# Patient Record
Sex: Female | Born: 1995
Health system: Southern US, Community
[De-identification: ages and names within clinical notes are randomized; demographics above are authoritative.]

## PROBLEM LIST (undated history)

## (undated) DIAGNOSIS — F32A Depression, unspecified: Secondary | ICD-10-CM

## (undated) DIAGNOSIS — F419 Anxiety disorder, unspecified: Secondary | ICD-10-CM

## (undated) HISTORY — DX: Anxiety disorder, unspecified: F41.9

---
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
## 2016-03-14 NOTE — Progress Notes (Deleted)
   CC: ***  HPI: Kim Smith is a 21 y.o. female with PMH significant for *** [facility] *** of *** duration.   Overdue health maintenance ***  Review of Symptoms:  See HPI for ROS.   CC, SH/smoking status, and VS noted.  Objective: There were no vitals taken for this visit. GEN: NAD, alert, cooperative, and pleasant.*** EYE: no conjunctival injection, pupils equally round and reactive to light ENMT: normal tympanic light reflex, no nasal polyps,no rhinorrhea, no pharyngeal erythema or exudates NECK: full ROM, no thyromegaly RESPIRATORY: clear to auscultation bilaterally with no wheezes, rhonchi or rales, good effort CV: RRR, no m/r/g, no peripheral edema GI: soft, non-tender, non-distended, normoactive bowel sounds, no hepatosplenomegaly SKIN: warm and dry, no rashes or lesions NEURO: II-XII grossly intact, normal gait, peripheral sensation intact PSYCH: AAOx3, appropriate affect  Flu Vaccine: *** Tdap Vaccine: *** - every 6072yrs - (<3 lifetime doses or unknown): all wounds -- look up need for Tetanus IG - (>=3 lifetime doses): clean/minor wound if >1372yrs from previous; all other wounds if >8367yrs from previous Zoster Vaccine: *** (those >50yo, once) Pneumonia Vaccine: *** (those w/ risk factors) - (<6058yr) Both: Immunocompromised, cochlear implant, CSF leak, asplenic, sickle cell, Chronic Renal Failure - (<6658yr) PPSV-23 only: Heart dz, lung disease, DM, tobacco abuse, alcoholism, cirrhosis/liver disease. - (>5658yr): PPSV13 then PPSV23 in 6-12mths;  - (>558yr): repeat PPSV23 once if pt received prior to 20yo and 6367yrs have passed  Assessment and plan:  No problem-specific Assessment & Plan notes found for this encounter.   No orders of the defined types were placed in this encounter.   No orders of the defined types were placed in this encounter.    Howard PouchLauren Arvle Grabe, MD,MS,  PGY1 03/14/2016 10:08 PM

## 2016-03-15 ENCOUNTER — Other Ambulatory Visit: Payer: Self-pay | Admitting: Family Medicine

## 2016-03-15 ENCOUNTER — Ambulatory Visit (INDEPENDENT_AMBULATORY_CARE_PROVIDER_SITE_OTHER): Payer: 59 | Admitting: Family Medicine

## 2016-03-15 VITALS — BP 110/72 | HR 96 | Temp 98.7°F | Ht 67.5 in | Wt 160.4 lb

## 2016-03-15 DIAGNOSIS — Z3009 Encounter for other general counseling and advice on contraception: Secondary | ICD-10-CM | POA: Diagnosis not present

## 2016-03-15 DIAGNOSIS — Z113 Encounter for screening for infections with a predominantly sexual mode of transmission: Secondary | ICD-10-CM

## 2016-03-15 NOTE — Assessment & Plan Note (Signed)
Patient currently has a Nexplanon. She has had this for about a year and is not happy with it. She would like to have her Nexplanon removed and an IUD inserted instead. -Patient will schedule appointment to have her Nexplanon removed an IUD inserted -We'll check for gonorrhea, chlamydia, RPR and HIV testing per patient's request -Continue condom use to prevent STDs

## 2016-03-15 NOTE — Patient Instructions (Signed)
Thank you for coming in today, it was so nice to see you! Today we talked about:    Please schedule a nexplanon removal and IUD insertion at our clinic. Ask the front desk to schedule you for a gynecology clinic visit.   If we ordered any tests today, you will be notified via telephone of any abnormalities. If everything is normal you will get a letter in the mail.   If you have any questions or concerns, please do not hesitate to call the office at [phone]. You can also message me directly via MyChart.   Sincerely,  Anders Simmondshristina Madeliene Tejera, MD

## 2016-03-15 NOTE — Progress Notes (Signed)
   Subjective:    Patient ID: Kim Smith , female   DOB: 1995-10-11 , 21 y.o..   MRN: [id number]  HPI  Kim Smith is here for  Chief Complaint  Patient presents with  . discuss birthcontrol   Patient is here today to discuss her birth control options. Patient notes that she has had a On in her left arm for the last year. Patient is not happy with her She feels like it has caused her to gain weight. She notes that she has gained approximately 20 pounds since she first had the Nexplanon placed. She also notes that the Nexlpanon has made her have a more labile mood including being more "emotional" and crying more often. She denies any depression or anxiety at this time. She just notes that sometimes she'll watch something saw on TV and will make her cry and this is not usual for her. She notes that she is sexually active at this time and is had 3 different partners in the last 6 months. She notes that she does use condoms to prevent STDs. She would like to have STD testing today just to be sure. She also notes that she has irregular bleeding since she has been on her Nexplanon. She does not have regular periods while she is on her On. She is interested in the 3 year IUD. She has done lots of research on the IUD and feels like it is the best option for her.  Review of Systems: Per HPI. All other systems reviewed and are negative.  Past Medical History: Patient Active Problem List   Diagnosis Date Noted  . General counseling and advice on female contraception 03/15/2016  . Perennial and seasonal allergic rhinitis 01/20/2015  . Allergy history, anesthetic 01/01/2015  . Breakthrough bleeding on Nexplanon 12/23/2014  . Constipation 12/10/2013  . External hemorrhoids without complication 12/10/2013  . Rectal bleeding 12/10/2013    Medications: reviewed and updated Current Outpatient Prescriptions  Medication Sig Dispense Refill  . etonogestrel (NEXPLANON) 68 MG IMPL implant 1 each by  Subdermal route once.    . fluconazole (DIFLUCAN) 150 MG tablet Take one tablet by mouth on Day 1. Repeat dose 2nd tablet on Day 3. 2 tablet 0  . fluticasone (FLONASE) 50 MCG/ACT nasal spray Place 1 spray into both nostrils 2 (two) times daily as needed for allergies or rhinitis. 16 g 3   No current facility-administered medications for this visit.     Social Hx:  reports that she has never smoked. She does not have any smokeless tobacco history on file.   Objective:   BP 110/72   Pulse 96   Temp 98.7 F (37.1 C) (Oral)   Ht 5' 7.5" (1.715 m)   Wt 160 lb 6.4 oz (72.8 kg)   SpO2 99%   BMI 24.75 kg/m  Physical Exam  Gen: NAD, alert, cooperative with exam, well-appearing Psych: good insight, normal mood and affect   Assessment & Plan:  General counseling and advice on female contraception Patient currently has a Nexplanon. She has had this for about a year and is not happy with it. She would like to have her Nexplanon removed and an IUD inserted instead. -Patient will schedule appointment to have her Nexplanon removed an IUD inserted -We'll check for gonorrhea, chlamydia, RPR and HIV testing per patient's request -Continue condom use to prevent STDs   Anders Simmondshristina Caden Fatica, MD [facility], PGY-2

## 2016-03-17 ENCOUNTER — Encounter: Payer: Self-pay | Admitting: Family Medicine

## 2016-03-17 ENCOUNTER — Ambulatory Visit (INDEPENDENT_AMBULATORY_CARE_PROVIDER_SITE_OTHER): Payer: 59 | Admitting: Family Medicine

## 2016-03-17 VITALS — BP 110/80 | HR 130 | Temp 99.9°F | Ht 67.0 in | Wt 160.0 lb

## 2016-03-17 DIAGNOSIS — Z3046 Encounter for surveillance of implantable subdermal contraceptive: Secondary | ICD-10-CM | POA: Diagnosis not present

## 2016-03-17 DIAGNOSIS — Z975 Presence of (intrauterine) contraceptive device: Secondary | ICD-10-CM

## 2016-03-17 DIAGNOSIS — Z30430 Encounter for insertion of intrauterine contraceptive device: Secondary | ICD-10-CM | POA: Diagnosis not present

## 2016-03-17 DIAGNOSIS — N921 Excessive and frequent menstruation with irregular cycle: Secondary | ICD-10-CM

## 2016-03-17 DIAGNOSIS — Z978 Presence of other specified devices: Secondary | ICD-10-CM

## 2016-03-17 MED ORDER — LEVONORGESTREL 20 MCG/24HR IU IUD
INTRAUTERINE_SYSTEM | Freq: Once | INTRAUTERINE | Status: AC
  Administered 2016-03-17: 1 via INTRAUTERINE

## 2016-03-17 NOTE — Progress Notes (Signed)
Patient has had a lot of break through bleeding wit the nexplanon so she wouldlike to have tat removed and try IUD. PROCEDURE NOTE: NEXPLANON  REMOVAL Patient given informed consent and signed copy in the chart. LEFT arm area prepped and draped in the usual sterile fashion. Three cc of lidocaine without epinephrine 1% used for local anesthesia. A small stab incision was made close to the nexplanon with scalpel. Hemostats were used to withdraw the nexplanon. A small bandage was applied over a steri strip  No complications.Patient given follow up instructions should she experience redness, swelling at sight or fever in the next 24 hours. Patient was reminded this totally removes her nexplanon contraceptive devise. (she can now potentially conceive)  IUD INSERTION: Patient given informed consent, signed copy in the chart..  Negative pregnancy confirmed.  Appropriate time out taken.   Sterile instruments and technique was used. Cervix brought into view with use of speculum and then cleansed three times with  betadine swabs.  A tenaculum was placed into the anterior lip of the cervix and a uterine sound was used to measure uterine size.   A Mirena IUD was placed into the endometrial cavity, deployed and secured. The applicator was removed. The strings were trimmed to 2 centimeters.   There were no complications and the patient tolerated the procedure well. She had some minor cramping and we administered 400 mg ibuprofen PO which she has used before without problem.   She was given handouts for post procedure instructions and information about the IUD including a card with the time of recommended removal. She was reminded that the IUD does not protect against sexually transmitted diseases.  Physicians involved in procedures: Dr. Elbert EwingsL. Mosetta PuttFeng and Dr Kathie RhodesS. Jennette KettleNeal

## 2016-03-18 MED ORDER — IBUPROFEN 200 MG PO TABS
400.0000 mg | ORAL_TABLET | Freq: Once | ORAL | Status: AC
  Administered 2016-03-17: 400 mg via ORAL

## 2016-04-04 ENCOUNTER — Encounter: Payer: Self-pay | Admitting: Internal Medicine

## 2016-04-21 LAB — HIV ANTIBODY (ROUTINE TESTING W REFLEX): HIV Screen 4th Generation wRfx: NONREACTIVE

## 2016-04-21 LAB — RPR: RPR Ser Ql: NONREACTIVE

## 2016-05-10 ENCOUNTER — Encounter (HOSPITAL_COMMUNITY): Payer: Self-pay | Admitting: Emergency Medicine

## 2016-05-10 ENCOUNTER — Ambulatory Visit (HOSPITAL_COMMUNITY)
Admission: EM | Admit: 2016-05-10 | Discharge: 2016-05-10 | Disposition: A | Discharge status: Home / Self Care | Payer: 59 | Attending: Family Medicine | Admitting: Family Medicine

## 2016-05-10 DIAGNOSIS — R091 Pleurisy: Secondary | ICD-10-CM

## 2016-05-10 DIAGNOSIS — J00 Acute nasopharyngitis [common cold]: Secondary | ICD-10-CM

## 2016-05-10 MED ORDER — IPRATROPIUM BROMIDE 0.06 % NA SOLN: 2.0000 | Freq: Four times a day (QID) | NASAL | 0 refills | Status: DC

## 2016-05-10 MED ORDER — IBUPROFEN 600 MG PO TABS: 600.0000 mg | ORAL_TABLET | Freq: Four times a day (QID) | ORAL | 0 refills | Status: DC | PRN

## 2016-05-10 MED FILL — IPRATROPIUM 0.06% SPRAY: 0.06 | 30 days supply | Qty: 15 | Fill #0

## 2016-05-10 MED FILL — IBUPROFEN 600 MG TABLET: 600 | 8 days supply | Qty: 30 | Fill #0

## 2016-05-10 NOTE — ED Provider Notes (Signed)
CSN: [id number]     Arrival date & time 05/10/16  1018 History   First MD Initiated Contact with Patient 05/10/16 1056     Chief Complaint  Patient presents with  . Pleurisy   (Consider location/radiation/quality/duration/timing/severity/associated sxs/prior Treatment) Patient c/o sharp pain right chest with respirations.  She states she has some sinus drainage.   The history is provided by the patient.  Chest Pain  Pain location:  R chest Pain quality: burning   Pain radiates to:  R shoulder Pain severity:  Moderate Onset quality:  Sudden Duration:  1 week Timing:  Constant Progression:  Worsening Chronicity:  New Relieved by:  Nothing Worsened by:  Nothing Ineffective treatments:  None tried   History reviewed. No pertinent past medical history. Past Surgical History:  Procedure Laterality Date  . NO PAST SURGERIES  01/20/15   History reviewed. No pertinent family history. Social History  Substance Use Topics  . Smoking status: Never Smoker  . Smokeless tobacco: Never Used  . Alcohol use Yes   OB History    No data available     Review of Systems  Constitutional: Negative.   HENT: Negative.   Eyes: Negative.   Respiratory: Negative.   Cardiovascular: Positive for chest pain.  Gastrointestinal: Negative.   Endocrine: Negative.   Genitourinary: Negative.   Musculoskeletal: Negative.   Allergic/Immunologic: Negative.   Neurological: Negative.   Hematological: Negative.   Psychiatric/Behavioral: Negative.     Allergies  Eggs or egg-derived products  Home Medications   Prior to Admission medications   Medication Sig Start Date End Date Taking? Authorizing Provider  ibuprofen (ADVIL,MOTRIN) 600 MG tablet Take 1 tablet (600 mg total) by mouth every 6 (six) hours as needed. 05/10/16   Deatra CanterWilliam J Carlesha Seiple, FNP  ipratropium (ATROVENT) 0.06 % nasal spray Place 2 sprays into both nostrils 4 (four) times daily. 05/10/16   Deatra CanterWilliam J Albert Hersch, FNP   Meds Ordered and  Administered this Visit  Medications - No data to display  BP 110/68 (BP Location: Right Arm)   Pulse 87   Temp 98.4 F (36.9 C) (Oral)   Resp 16   SpO2 100%  No data found.   Physical Exam  Constitutional: She appears well-developed and well-nourished.  HENT:  Head: Normocephalic and atraumatic.  Right Ear: External ear normal.  Left Ear: External ear normal.  Mouth/Throat: Oropharynx is clear and moist.  Eyes: Conjunctivae and EOM are normal. Pupils are equal, round, and reactive to light.  Neck: Normal range of motion. Neck supple.  Cardiovascular: Normal rate, regular rhythm and normal heart sounds.   Pulmonary/Chest: Effort normal and breath sounds normal.  Abdominal: Soft. Bowel sounds are normal.  Nursing note and vitals reviewed.   Urgent Care Course     Procedures (including critical care time)  Labs Review Labs Reviewed - No data to display  Imaging Review No results found.   Visual Acuity Review  Right Eye Distance:   Left Eye Distance:   Bilateral Distance:    Right Eye Near:   Left Eye Near:    Bilateral Near:         MDM   1. Pleurisy   2. Other acute rhinitis    Naprosyn 500mg  one po bid x 7 days #14 Atrovent Nasal Spray       Deatra CanterWilliam J Eero Dini, FNP 05/10/16 1153

## 2016-05-10 NOTE — ED Triage Notes (Signed)
The patient reported sharp pain in the right side of her rib cage when she takes a deep breath that started 2 weeks ago.

## 2016-05-11 ENCOUNTER — Ambulatory Visit: Payer: Self-pay | Admitting: Internal Medicine

## 2016-05-17 ENCOUNTER — Ambulatory Visit (INDEPENDENT_AMBULATORY_CARE_PROVIDER_SITE_OTHER): Payer: 59 | Admitting: Internal Medicine

## 2016-05-17 ENCOUNTER — Encounter: Payer: Self-pay | Admitting: Internal Medicine

## 2016-05-17 DIAGNOSIS — R091 Pleurisy: Secondary | ICD-10-CM

## 2016-05-17 MED ORDER — MELOXICAM 15 MG PO TABS: 15.0000 mg | ORAL_TABLET | Freq: Every day | ORAL | 0 refills | Status: DC

## 2016-05-17 MED FILL — MELOXICAM 15 MG TABLET: 15 | 30 days supply | Qty: 30 | Fill #0

## 2016-05-17 NOTE — Progress Notes (Signed)
   Subjective:   Patient: Kim Smith       Birthdate: [birth year]       MRN: [id number]      HPI  Kim Durieshley L Esteban is a 21 y.o. female presenting for same day clinic for f/u of pleurisy.   Pleurisy/pain with deep inspiration Patient was seen for same complaint on 03/20 in ED. She was diagnosed with pleuritic chest pain and rhinitis and discharged with ibuprofen and Atrovent. Patient presents today for f/u. Patient reports some improvement in pain but says it is still present. Pain resolves with 600mg  ibuprofen BID. Pain is present only on R. She has pain when laying on her R side at night, but is able to sleep on her L side without problems. Pain is worse with deep inspiration and when standing. Pain continues to occur daily. Says she feels like there is a "bubble" next to her lung when she takes a deep breath. Denies difficulty breathing, just pain.   Smoking status reviewed. Patient is never smoker.   Review of Systems See HPI.     Objective:  Physical Exam  Constitutional: She is oriented to person, place, and time and well-developed, well-nourished, and in no distress.  HENT:  Head: Normocephalic and atraumatic.  Eyes: Conjunctivae and EOM are normal. Right eye exhibits no discharge. Left eye exhibits no discharge.  Cardiovascular: Normal rate, regular rhythm and normal heart sounds.   No murmur heard. Pulmonary/Chest:  Lungs CTAB. Good air movement. No TTP of chest wall. Patient denies pain with deep inspiration during exam.   Neurological: She is alert and oriented to person, place, and time.  Skin: Skin is warm and dry.  Psychiatric: Affect and judgment normal.      Assessment & Plan:  Pleurisy Symptoms consistent with diagnosis of pleurisy given in ED. Reassuring that pain resolves with ibuprofen and that patient with no difficulty breathing. Lung exam benign and patient actually without pan during deep inspiration on lung exam. Discussed that it can take time for  inflammation to resolve, but that she can continue to take anti-inflammatory meds. Will switch to Mobic so patient can take fewer pills and only once a day.  - Mobic 15mg  qd - Return if pain persists even after completing pills. Will consider CXR at this time.  - Return if difficulty breathign   Tarri AbernethyAbigail J Darely Becknell, MD, MPH PGY-2 [facility] Pager [phone]

## 2016-05-17 NOTE — Patient Instructions (Addendum)
It was nice seeing you today Kim Smith!  You can begin taking Mobic (meloxicam) once a day. Taking it in the morning should help relieve your pain for the whole day. Do not take ibuprofen while you are taking Mobic. If you are still having pain, you can take Tylenol with Mobic.   If you are still having pain after finishing the Mobic I prescribed today, please call to schedule another appointment.   If you start to have trouble breathing or worsening pain, please go to the emergency room.   If you have any questions or concerns, please feel free to call the clinic.   Be well,  Dr. Natale MilchLancaster

## 2016-05-18 DIAGNOSIS — R091 Pleurisy: Secondary | ICD-10-CM | POA: Insufficient documentation

## 2016-05-18 NOTE — Assessment & Plan Note (Signed)
Symptoms consistent with diagnosis of pleurisy given in ED. Reassuring that pain resolves with ibuprofen and that patient with no difficulty breathing. Lung exam benign and patient actually without pan during deep inspiration on lung exam. Discussed that it can take time for inflammation to resolve, but that she can continue to take anti-inflammatory meds. Will switch to Mobic so patient can take fewer pills and only once a day.  - Mobic 15mg  qd - Return if pain persists even after completing pills. Will consider CXR at this time.  - Return if difficulty breathign

## 2016-06-01 ENCOUNTER — Encounter: Payer: Self-pay | Admitting: Gastroenterology

## 2016-06-09 ENCOUNTER — Other Ambulatory Visit (INDEPENDENT_AMBULATORY_CARE_PROVIDER_SITE_OTHER): Payer: 59

## 2016-06-09 ENCOUNTER — Ambulatory Visit (INDEPENDENT_AMBULATORY_CARE_PROVIDER_SITE_OTHER)
Admission: RE | Admit: 2016-06-09 | Discharge: 2016-06-09 | Disposition: A | Discharge status: Home / Self Care | Payer: 59 | Source: Ambulatory Visit | Attending: Gastroenterology | Admitting: Gastroenterology

## 2016-06-09 ENCOUNTER — Ambulatory Visit (INDEPENDENT_AMBULATORY_CARE_PROVIDER_SITE_OTHER): Payer: 59 | Admitting: Gastroenterology

## 2016-06-09 ENCOUNTER — Encounter: Payer: Self-pay | Admitting: Gastroenterology

## 2016-06-09 VITALS — BP 90/60 | HR 84 | Ht 67.0 in | Wt 157.0 lb

## 2016-06-09 DIAGNOSIS — R091 Pleurisy: Secondary | ICD-10-CM

## 2016-06-09 DIAGNOSIS — R109 Unspecified abdominal pain: Secondary | ICD-10-CM | POA: Diagnosis not present

## 2016-06-09 DIAGNOSIS — R1011 Right upper quadrant pain: Secondary | ICD-10-CM

## 2016-06-09 LAB — CBC WITH DIFFERENTIAL/PLATELET
Basophils Absolute: 0.1 10*3/uL (ref 0.0–0.1)
Basophils Relative: 0.5 % (ref 0.0–3.0)
Eosinophils Absolute: 0.1 10*3/uL (ref 0.0–0.7)
Eosinophils Relative: 0.7 % (ref 0.0–5.0)
HCT: 40.5 % (ref 36.0–46.0)
Hemoglobin: 13.4 g/dL (ref 12.0–15.0)
Lymphocytes Relative: 27.2 % (ref 12.0–46.0)
Lymphs Abs: 3.1 10*3/uL (ref 0.7–4.0)
MCHC: 33.2 g/dL (ref 30.0–36.0)
MCV: 87.8 fl (ref 78.0–100.0)
Monocytes Absolute: 0.9 10*3/uL (ref 0.1–1.0)
Monocytes Relative: 7.7 % (ref 3.0–12.0)
Neutro Abs: 7.2 10*3/uL (ref 1.4–7.7)
Neutrophils Relative %: 63.9 % (ref 43.0–77.0)
Platelets: 286 10*3/uL (ref 150.0–400.0)
RBC: 4.62 Mil/uL (ref 3.87–5.11)
RDW: 13.7 % (ref 11.5–14.6)
WBC: 11.3 10*3/uL — ABNORMAL HIGH (ref 4.5–10.5)

## 2016-06-09 LAB — COMPREHENSIVE METABOLIC PANEL
ALT: 28 U/L (ref 0–35)
AST: 17 U/L (ref 0–37)
Albumin: 4.1 g/dL (ref 3.5–5.2)
Alkaline Phosphatase: 135 U/L — ABNORMAL HIGH (ref 39–117)
BUN: 13 mg/dL (ref 6–23)
CO2: 28 mEq/L (ref 19–32)
Calcium: 9.6 mg/dL (ref 8.4–10.5)
Chloride: 105 mEq/L (ref 96–112)
Creatinine, Ser: 0.61 mg/dL (ref 0.40–1.20)
GFR: 132.68 mL/min (ref >60.00)
Glucose, Bld: 76 mg/dL (ref 70–99)
Potassium: 3.9 mEq/L (ref 3.5–5.1)
Sodium: 138 mEq/L (ref 135–145)
Total Bilirubin: 0.4 mg/dL (ref 0.2–1.2)
Total Protein: 8.2 g/dL (ref 6.0–8.3)

## 2016-06-09 LAB — HCG, QUANTITATIVE, PREGNANCY: Quantitative HCG: 0.14 m[IU]/mL

## 2016-06-09 NOTE — Progress Notes (Signed)
06/09/2016 IRVING LUBBERS [id number] 03-08-1995   HISTORY OF PRESENT ILLNESS:  This is a 21 year old female who is new to our office. She presents to our office today with her mother at the request of her PCP, Dr. Natale Milch, for evaluation of what was diagnosed as "pleurisy".  She reports that she has pain up underneath her right rib cage that hurts when she takes a deep breath.  Says that pain shoots around into her right shoulder blade and up the right side of her back when she takes a deep breath.  She tells me that this has been present for about the past month.  She was in the ED and has seen her PCP but no evaluation has been done.  She was told to take Mobic, which she says does help that pain.  Says that if she does not take it then it is very severe.  She reports some other random GI complaints, but they are not associated with this pain and have been present for a while.  She says that she has had constipation as long as she can remember.  Reports that on rare occasion she will get nausea.  Feels full sometimes after drinking liquids, even water, but this does not occur with eating food.  Gets occasional diarrhea/loose stools with certain foods.  Just of note, she is sexually active and has been evaluated on a couple of occasions for STD's.  She denies pelvic pain, but does report increase in vaginal discharge but says that she thought it was from her IUD (that was placed in February).  Was just thinking of possible Lynnae January as cause for this pleuritic RUQ pain.   Past Medical History:  Diagnosis Date  . Anxiety    Past Surgical History:  Procedure Laterality Date  . NO PAST SURGERIES  01/20/15    reports that she has never smoked. She has never used smokeless tobacco. She reports that she does not drink alcohol or use drugs. family history includes Breast cancer in her mother; Diabetes in her maternal grandmother and paternal grandmother. Allergies  Allergen Reactions    . Eggs Or Egg-Derived Products Nausea And Vomiting      Outpatient Encounter Prescriptions as of 06/09/2016  Medication Sig  . ibuprofen (ADVIL,MOTRIN) 600 MG tablet Take 1 tablet (600 mg total) by mouth every 6 (six) hours as needed.  . meloxicam (MOBIC) 15 MG tablet Take 1 tablet (15 mg total) by mouth daily.  . [DISCONTINUED] ipratropium (ATROVENT) 0.06 % nasal spray Place 2 sprays into both nostrils 4 (four) times daily.   No facility-administered encounter medications on file as of 06/09/2016.      REVIEW OF SYSTEMS  : All other systems reviewed and negative except where noted in the History of Present Illness.   PHYSICAL EXAM: BP 90/60   Pulse 84   Ht  (1.702 m)   Wt 157 lb (71.2 kg)   BMI 24.59 kg/m  General: Well developed female in no acute distress Head: Normocephalic and atraumatic Eyes:  Sclerae anicteric, conjunctiv pink. Ears: Normal auditory acuity  Lungs: Clear throughout to auscultation; no increased WOB. Heart: Regular rate and rhythm Abdomen: Soft, non-distended.  Normal bowel sounds.  RUQ TTP, also seems to be tender over her ribcage in the same area. Musculoskeletal: Symmetrical with no gross deformities  Skin: No lesions on visible extremities Extremities: No edema  Neurological: Alert oriented x 4, grossly non-focal Psychological:  Alert and  cooperative. Normal mood and affect  ASSESSMENT AND PLAN: -21 year old female with complaints of RUQ abdominal pain.  Likely worsened by taking a deep breath. Improved by taking NSAIDs. No other hard/definite GI symptoms. Reports intermittent bloating, rare nausea, intermittent loose stools, chronic constipation, but not anything necessarily in association with this pain. She is tender over her ribcage in that area as well. Question gallbladder related versus costochondritis.  We'll check some labs including a CBC, CMP. We'll schedule an abdominal ultrasound. Advised that she can begin taking a daily probiotic for  her other occasional, mild GI symptoms which are likely functional.  CC:  Kim Smith*

## 2016-06-09 NOTE — Patient Instructions (Signed)
If you are age 20 or older, your body mass index should be between 23-30. Your Body mass index is 24.59 kg/m. If this is out of the aforementioned range listed, please consider follow up with your Primary Care Provider.  If you are age 31 or younger, your body mass index should be between 19-25. Your Body mass index is 24.59 kg/m. If this is out of the aformentioned range listed, please consider follow up with your Primary Care Provider.   You have been scheduled for an abdominal ultrasound at [facility] (1st floor of hospital) on 06/13/16 at 1100 am. Please arrive 15 minutes prior to your appointment for registration. Make certain not to have anything to eat or drink 8 hours prior to your appointment. Should you need to reschedule your appointment, please contact radiology at [phone]. This test typically takes about 30 minutes to perform.  Your physician has requested that you go to the basement for lab work and x-ray before leaving today.  Start Probiotic such as Land, Culturelle, or Align daily.  Thank you for choosing me and [facility].  Doug Sou, PA-C

## 2016-06-10 DIAGNOSIS — R1011 Right upper quadrant pain: Secondary | ICD-10-CM | POA: Insufficient documentation

## 2016-06-10 NOTE — Progress Notes (Signed)
Agree with assessment and plan as outlined.  

## 2016-06-13 ENCOUNTER — Ambulatory Visit (HOSPITAL_COMMUNITY)
Admission: RE | Admit: 2016-06-13 | Discharge: 2016-06-13 | Disposition: A | Discharge status: Home / Self Care | Payer: 59 | Source: Ambulatory Visit | Attending: Gastroenterology | Admitting: Gastroenterology

## 2016-06-13 DIAGNOSIS — R1011 Right upper quadrant pain: Secondary | ICD-10-CM | POA: Diagnosis not present

## 2016-09-23 ENCOUNTER — Ambulatory Visit: Payer: 59 | Admitting: Internal Medicine

## 2016-09-26 ENCOUNTER — Ambulatory Visit (INDEPENDENT_AMBULATORY_CARE_PROVIDER_SITE_OTHER): Payer: 59 | Admitting: Internal Medicine

## 2016-09-26 ENCOUNTER — Encounter: Payer: Self-pay | Admitting: Internal Medicine

## 2016-09-26 ENCOUNTER — Encounter: Payer: Self-pay | Admitting: Psychology

## 2016-09-26 VITALS — BP 102/62 | HR 67 | Temp 98.2°F | Ht 67.5 in | Wt 155.0 lb

## 2016-09-26 DIAGNOSIS — Z978 Presence of other specified devices: Secondary | ICD-10-CM | POA: Diagnosis not present

## 2016-09-26 DIAGNOSIS — F418 Other specified anxiety disorders: Secondary | ICD-10-CM | POA: Insufficient documentation

## 2016-09-26 DIAGNOSIS — Z975 Presence of (intrauterine) contraceptive device: Secondary | ICD-10-CM

## 2016-09-26 DIAGNOSIS — F411 Generalized anxiety disorder: Secondary | ICD-10-CM

## 2016-09-26 DIAGNOSIS — N921 Excessive and frequent menstruation with irregular cycle: Secondary | ICD-10-CM

## 2016-09-26 LAB — POCT URINE PREGNANCY: Preg Test, Ur: NEGATIVE

## 2016-09-26 MED ORDER — CITALOPRAM HYDROBROMIDE 10 MG PO TABS: 10.0000 mg | ORAL_TABLET | Freq: Every day | ORAL | 0 refills | Status: DC

## 2016-09-26 MED ORDER — HYDROXYZINE HCL 50 MG PO TABS: 50.0000 mg | ORAL_TABLET | Freq: Three times a day (TID) | ORAL | 0 refills | Status: DC | PRN

## 2016-09-26 MED FILL — CITALOPRAM HBR 10 MG TABLET: 10 | 30 days supply | Qty: 30 | Fill #0

## 2016-09-26 MED FILL — hydrOXYzine HCL 50 MG TABS: 50 | 10 days supply | Qty: 30 | Fill #0
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
## 2016-09-26 NOTE — Assessment & Plan Note (Signed)
GAD-7 15, significantly impacting her daily life. Will check patient for metabolic disorders which could cause anxiety though unlikely. -Will provide patient with Celexa can increase the dose from 10 mg to 20 mg -Hydroxyzine as needed for significant episodes of anxiety - TSH - CBC - CMP14+EGFR - citalopram (CELEXA) 10 MG tablet; Take 1 tablet (10 mg total) by mouth daily.  Dispense: 30 tablet; Refill: 0 - hydrOXYzine (ATARAX/VISTARIL) 50 MG tablet; Take 1 tablet (50 mg total) by mouth 3 (three) times daily as needed.  Dispense: 30 tablet; Refill: 0 - POCT urine pregnancy - noted nausea; though has an IUD in place

## 2016-09-26 NOTE — Patient Instructions (Signed)
I want to start you on a low dose medication called Celexa - after 1 week used to not have any significant symptoms can increase from 1 pill to 2 pills . If you are having significant anxiety you can take the Vistaril in between. I want you to follow-up with behavioral medicine. I want you to come back in 2 weeks.

## 2016-09-27 ENCOUNTER — Encounter: Payer: Self-pay | Admitting: Internal Medicine

## 2016-09-27 ENCOUNTER — Ambulatory Visit: Payer: 59 | Admitting: Internal Medicine

## 2016-09-27 LAB — CBC
Hematocrit: 46.3 % (ref 34.0–46.6)
Hemoglobin: 15.4 g/dL (ref 11.1–15.9)
MCH: 29.7 pg (ref 26.6–33.0)
MCHC: 33.3 g/dL (ref 31.5–35.7)
MCV: 89 fL (ref 79–97)
Platelets: 263 10*3/uL (ref 150–379)
RBC: 5.18 x10E6/uL (ref 3.77–5.28)
RDW: 14.7 % (ref 12.3–15.4)
WBC: 9.5 10*3/uL (ref 3.4–10.8)

## 2016-09-27 LAB — CMP14+EGFR
ALT: 20 IU/L (ref 0–32)
AST: 22 IU/L (ref 0–40)
Albumin/Globulin Ratio: 1.3 (ref 1.2–2.2)
Albumin: 4.3 g/dL (ref 3.5–5.5)
Alkaline Phosphatase: 130 IU/L — ABNORMAL HIGH (ref 39–117)
BUN/Creatinine Ratio: 11 (ref 9–23)
BUN: 7 mg/dL (ref 6–20)
Bilirubin Total: 0.3 mg/dL (ref 0.0–1.2)
CO2: 23 mmol/L (ref 20–29)
Calcium: 9.8 mg/dL (ref 8.7–10.2)
Chloride: 104 mmol/L (ref 96–106)
Creatinine, Ser: 0.64 mg/dL (ref 0.57–1.00)
GFR calc Af Amer: 149 mL/min/{1.73_m2} (ref >59)
GFR calc non Af Amer: 129 mL/min/{1.73_m2} (ref >59)
Globulin, Total: 3.2 g/dL (ref 1.5–4.5)
Glucose: 89 mg/dL (ref 65–99)
Potassium: 4.8 mmol/L (ref 3.5–5.2)
Sodium: 141 mmol/L (ref 134–144)
Total Protein: 7.5 g/dL (ref 6.0–8.5)

## 2016-09-27 LAB — TSH: TSH: 0.999 u[IU]/mL (ref 0.450–4.500)

## 2016-10-03 ENCOUNTER — Telehealth: Payer: Self-pay | Admitting: Psychology

## 2016-10-03 ENCOUNTER — Ambulatory Visit: Payer: Self-pay | Admitting: Psychology

## 2016-10-03 NOTE — Telephone Encounter (Signed)
I called the patient to give her information about a referral and the anxiety issue that Spero GeraldsMichelle Kane brought up. She did not answer the phone so message was left.

## 2016-10-12 ENCOUNTER — Ambulatory Visit (INDEPENDENT_AMBULATORY_CARE_PROVIDER_SITE_OTHER): Payer: 59 | Admitting: Internal Medicine

## 2016-10-12 ENCOUNTER — Encounter: Payer: Self-pay | Admitting: Internal Medicine

## 2016-10-12 DIAGNOSIS — F411 Generalized anxiety disorder: Secondary | ICD-10-CM | POA: Diagnosis not present

## 2016-10-12 MED ORDER — CITALOPRAM HYDROBROMIDE 40 MG PO TABS: 40.0000 mg | ORAL_TABLET | Freq: Every day | ORAL | 3 refills | Status: DC

## 2016-10-12 MED FILL — CITALOPRAM HBR 40 MG TABLET: 40 | 30 days supply | Qty: 30 | Fill #0

## 2016-10-12 NOTE — Patient Instructions (Addendum)
It was nice seeing you again today Shreenika!  Please increase your Celexa dose to 40mg  daily. You can continue taking four 10 mg tablets until you run out, then switch to one 40 mg tablet per day. Please keep your appointment with Carollee Herter on Monday.   I will see you back in six weeks to see how you are doing, but feel free to schedule an appointment sooner if you would like.   If you have any questions or concerns, please feel free to call the clinic.   Be well,  Dr. Natale Milch

## 2016-10-12 NOTE — Assessment & Plan Note (Signed)
Some improvement since beginning Celexa 20mg , though still with significant anxiety and associated physical symptoms. Patient is interested in increasing dose today. Not experiencing side effect of fatigue anymore after beginning to take dose before bed. Will increase to 40mg  daily and f/u in 6 weeks. Patient also amenable to continuing to see [facility], and has appt in four days.  - F/u in 6 weeks - Continue following with [facility] Carollee Herter [place])
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
## 2016-10-17 ENCOUNTER — Telehealth: Payer: Self-pay | Admitting: Psychology

## 2016-10-17 NOTE — Telephone Encounter (Signed)
I called the patient again today and was able to speak with her. She was given contact information for [facility] and [facility] of the [place]. I advised her that she has a provisional diagnosis of PTSD based upon her PCL5. I explained that outpatient therapy consisting of PE or CPT is recommended. I also explained that her marijuana usage may be exacerbating or even causing some of her anxiety symptoms. She verbalized understanding. I asked her if I could call her again to check on her progress and she said that would be fine. She will call [facility] as needed.

## 2017-02-23 ENCOUNTER — Telehealth: Payer: Self-pay | Admitting: Internal Medicine

## 2017-03-09 ENCOUNTER — Encounter: Payer: Self-pay | Admitting: Internal Medicine

## 2017-03-09 ENCOUNTER — Ambulatory Visit (INDEPENDENT_AMBULATORY_CARE_PROVIDER_SITE_OTHER): Payer: No Typology Code available for payment source | Admitting: Internal Medicine

## 2017-03-09 VITALS — BP 106/64 | HR 74 | Temp 98.0°F | Resp 16 | Ht 68.0 in | Wt 157.8 lb

## 2017-03-09 DIAGNOSIS — Z124 Encounter for screening for malignant neoplasm of cervix: Secondary | ICD-10-CM | POA: Diagnosis not present

## 2017-03-09 DIAGNOSIS — Z23 Encounter for immunization: Secondary | ICD-10-CM

## 2017-03-09 DIAGNOSIS — F418 Other specified anxiety disorders: Secondary | ICD-10-CM

## 2017-03-09 MED ORDER — VILAZODONE HCL 10 & 20 MG PO KIT
1.0000 | PACK | Freq: Every day | ORAL | 0 refills | Status: DC
Start: 2017-03-09 — End: 2017-05-16

## 2017-03-09 NOTE — Progress Notes (Signed)
Subjective:  Patient ID: Kim Smith, female    DOB: 1996-02-13  Age: 22 y.o. MRN: [id number]  CC: Depression   HPI Kim Smith presents for ongoing concerns about anxiety, panic attacks, and trichotillomania.  She was previously treated with citalopram but did not notice much improvement.  She otherwise feels well and offers no other complaints today.  History Kim Smith has a past medical history of Anxiety.   She has a past surgical history that includes No past surgeries (01/20/15).   Her family history includes Breast cancer in her mother; Diabetes in her maternal grandmother and paternal grandmother.She reports that  has never smoked. she has never used smokeless tobacco. She reports that she does not drink alcohol or use drugs.  Outpatient Medications Prior to Visit  Medication Sig Dispense Refill  . citalopram (CELEXA) 40 MG tablet Take 1 tablet (40 mg total) by mouth daily. 30 tablet 3  . hydrOXYzine (ATARAX/VISTARIL) 50 MG tablet Take 1 tablet (50 mg total) by mouth 3 (three) times daily as needed. 30 tablet 0  . ibuprofen (ADVIL,MOTRIN) 600 MG tablet Take 1 tablet (600 mg total) by mouth every 6 (six) hours as needed. 30 tablet 0  . meloxicam (MOBIC) 15 MG tablet Take 1 tablet (15 mg total) by mouth daily. 30 tablet 0   No facility-administered medications prior to visit.     ROS Review of Systems  Constitutional: Negative.  Negative for appetite change, diaphoresis, fatigue and unexpected weight change.  HENT: Negative.  Negative for trouble swallowing.   Eyes: Negative for visual disturbance.  Respiratory: Negative for cough, chest tightness, shortness of breath and wheezing.   Cardiovascular: Negative for chest pain, palpitations and leg swelling.  Gastrointestinal: Negative for abdominal pain.  Endocrine: Negative.   Genitourinary: Negative.  Negative for vaginal bleeding and vaginal discharge.  Musculoskeletal: Negative.  Negative for myalgias and neck pain.    Skin: Negative.   Neurological: Negative.   Hematological: Negative for adenopathy. Does not bruise/bleed easily.  Psychiatric/Behavioral: Negative for confusion, decreased concentration, dysphoric mood, self-injury, sleep disturbance and suicidal ideas. The patient is nervous/anxious.     Objective:  BP 106/64 (BP Location: Left Arm, Patient Position: Sitting, Cuff Size: Normal)   Pulse 74   Temp 98 F (36.7 C) (Oral)   Resp 16   Ht '5\' 8"'  (1.727 m)   Wt 157 lb 12 oz (71.6 kg)   SpO2 98%   BMI 23.99 kg/m   Physical Exam  Constitutional: She is oriented to person, place, and time. No distress.  HENT:  Mouth/Throat: No oropharyngeal exudate.  Eyes: Conjunctivae are normal. Left eye exhibits no discharge.  Neck: Normal range of motion. Neck supple. No JVD present. No thyromegaly present.  Cardiovascular: Normal rate, regular rhythm and normal heart sounds. Exam reveals no gallop.  No murmur heard. Pulmonary/Chest: Effort normal and breath sounds normal. No respiratory distress. She has no wheezes. She has no rales.  Abdominal: Soft. Bowel sounds are normal. She exhibits no distension and no mass. There is no tenderness.  Musculoskeletal: Normal range of motion. She exhibits no edema, tenderness or deformity.  Lymphadenopathy:    She has no cervical adenopathy.  Neurological: She is alert and oriented to person, place, and time.  Skin: Skin is warm. No rash noted. She is not diaphoretic. No erythema. No pallor.  Psychiatric: She has a normal mood and affect. Her behavior is normal. Judgment and thought content normal.  Vitals reviewed.   Lab Results  Component Value Date   WBC 9.5 09/26/2016   HGB 15.4 09/26/2016   HCT 46.3 09/26/2016   PLT 263 09/26/2016   GLUCOSE 89 09/26/2016   CHOL 167 11/05/2012   TRIG 87 11/05/2012   HDL 55 11/05/2012   LDLCALC 95 11/05/2012   ALT 20 09/26/2016   AST 22 09/26/2016   NA 141 09/26/2016   K 4.8 09/26/2016   CL 104 09/26/2016    CREATININE 0.64 09/26/2016   BUN 7 09/26/2016   CO2 23 09/26/2016   TSH 0.999 09/26/2016    Assessment & Plan:   Kim Smith was seen today for depression.  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  Need for influenza vaccination -     Flu Vaccine QUAD 36+ mos IM   I have discontinued Kim Smith's ibuprofen, meloxicam, hydrOXYzine, and citalopram. I am also having her start on Vilazodone HCl.  Meds ordered this encounter  Medications  . Vilazodone HCl (VIIBRYD STARTER PACK) 10 & 20 MG KIT    Sig: Take 1 tablet by mouth daily.    Dispense:  1 kit    Refill:  0     Follow-up: Return in about 3 months (around 06/07/2017).  Scarlette Calico, MD

## 2017-03-09 NOTE — Patient Instructions (Signed)
Major Depressive Disorder, Adult Major depressive disorder (MDD) is a mental health condition. It may also be called clinical depression or unipolar depression. MDD usually causes feelings of sadness, hopelessness, or helplessness. MDD can also cause physical symptoms. It can interfere with work, school, relationships, and other everyday activities. MDD may be mild, moderate, or severe. It may occur once (single episode major depressive disorder) or it may occur multiple times (recurrent major depressive disorder). What are the causes? The exact cause of this condition is not known. MDD is most likely caused by a combination of things, which may include:  Genetic factors. These are traits that are passed along from parent to child.  Individual factors. Your personality, your behavior, and the way you handle your thoughts and feelings may contribute to MDD. This includes personality traits and behaviors learned from others.  Physical factors, such as: ? Differences in the part of your brain that controls emotion. This part of your brain may be different than it is in people who do not have MDD. ? Long-term (chronic) medical or psychiatric illnesses.  Social factors. Traumatic experiences or major life changes may play a role in the development of MDD.  What increases the risk? This condition is more likely to develop in women. The following factors may also make you more likely to develop MDD:  A family history of depression.  Troubled family relationships.  Abnormally low levels of certain brain chemicals.  Traumatic events in childhood, especially abuse or the loss of a parent.  Being under a lot of stress, or long-term stress, especially from upsetting life experiences or losses.  A history of: ? Chronic physical illness. ? Other mental health disorders. ? Substance abuse.  Poor living conditions.  Experiencing social exclusion or discrimination on a regular basis.  What are  the signs or symptoms? The main symptoms of MDD typically include:  Constant depressed or irritable mood.  Loss of interest in things and activities.  MDD symptoms may also include:  Sleeping or eating too much or too little.  Unexplained weight change.  Fatigue or low energy.  Feelings of worthlessness or guilt.  Difficulty thinking clearly or making decisions.  Thoughts of suicide or of harming others.  Physical agitation or weakness.  Isolation.  Severe cases of MDD may also occur with other symptoms, such as:  Delusions or hallucinations, in which you imagine things that are not real (psychotic depression).  Low-level depression that lasts at least a year (chronic depression or persistent depressive disorder).  Extreme sadness and hopelessness (melancholic depression).  Trouble speaking and moving (catatonic depression).  How is this diagnosed? This condition may be diagnosed based on:  Your symptoms.  Your medical history, including your mental health history. This may involve tests to evaluate your mental health. You may be asked questions about your lifestyle, including any drug and alcohol use, and how long you have had symptoms of MDD.  A physical exam.  Blood tests to rule out other conditions.  You must have a depressed mood and at least four other MDD symptoms most of the day, nearly every day in the same 2-week timeframe before your health care provider can confirm a diagnosis of MDD. How is this treated? This condition is usually treated by mental health professionals, such as psychologists, psychiatrists, and clinical social workers. You may need more than one type of treatment. Treatment may include:  Psychotherapy. This is also called talk therapy or counseling. Types of psychotherapy include: ? Cognitive behavioral   therapy (CBT). This type of therapy teaches you to recognize unhealthy feelings, thoughts, and behaviors, and replace them with  positive thoughts and actions. ? Interpersonal therapy (IPT). This helps you to improve the way you relate to and communicate with others. ? Family therapy. This treatment includes members of your family.  Medicine to treat anxiety and depression, or to help you control certain emotions and behaviors.  Lifestyle changes, such as: ? Limiting alcohol and drug use. ? Exercising regularly. ? Getting plenty of sleep. ? Making healthy eating choices. ? Spending more time outdoors.  Treatments involving stimulation of the brain can be used in situations with extremely severe symptoms, or when medicine or other therapies do not work over time. These treatments include electroconvulsive therapy, transcranial magnetic stimulation, and vagal nerve stimulation. Follow these instructions at home: Activity  Return to your normal activities as told by your health care provider.  Exercise regularly and spend time outdoors as told by your health care provider. General instructions  Take over-the-counter and prescription medicines only as told by your health care provider.  Do not drink alcohol. If you drink alcohol, limit your alcohol intake to no more than 1 drink a day for nonpregnant women and 2 drinks a day for men. One drink equals 12 oz of beer, 5 oz of wine, or 1 oz of hard liquor. Alcohol can affect any antidepressant medicines you are taking. Talk to your health care provider about your alcohol use.  Eat a healthy diet and get plenty of sleep.  Find activities that you enjoy doing, and make time to do them.  Consider joining a support group. Your health care provider may be able to recommend a support group.  Keep all follow-up visits as told by your health care provider. This is important. Where to find more information: National Alliance on Mental Illness  www.nami.org  [place] National Institute of Mental Health  www.nimh.nih.gov  National Suicide Prevention  Lifeline  1-800-273-TALK (8255). This is free, 24-hour help.  Contact a health care provider if:  Your symptoms get worse.  You develop new symptoms. Get help right away if:  You self-harm.  You have serious thoughts about hurting yourself or others.  You see, hear, taste, smell, or feel things that are not present (hallucinate). This information is not intended to replace advice given to you by your health care provider. Make sure you discuss any questions you have with your health care provider. Document Released: 06/04/2012 Document Revised: 10/15/2015 Document Reviewed: 08/19/2015 Elsevier Interactive Patient Education  2018 [employer]  

## 2017-03-13 ENCOUNTER — Encounter: Payer: Self-pay | Admitting: Internal Medicine

## 2017-03-22 ENCOUNTER — Encounter: Payer: Self-pay | Admitting: Internal Medicine

## 2017-03-22 NOTE — Telephone Encounter (Signed)
Error

## 2017-05-16 ENCOUNTER — Ambulatory Visit (INDEPENDENT_AMBULATORY_CARE_PROVIDER_SITE_OTHER): Payer: No Typology Code available for payment source | Admitting: Internal Medicine

## 2017-05-16 ENCOUNTER — Encounter: Payer: Self-pay | Admitting: Internal Medicine

## 2017-05-16 VITALS — BP 124/84 | HR 98 | Temp 97.3°F | Resp 12 | Ht 68.0 in | Wt 163.0 lb

## 2017-05-16 DIAGNOSIS — J029 Acute pharyngitis, unspecified: Secondary | ICD-10-CM

## 2017-05-16 DIAGNOSIS — J020 Streptococcal pharyngitis: Secondary | ICD-10-CM

## 2017-05-16 LAB — POCT RAPID STREP A (OFFICE): Rapid Strep A Screen: POSITIVE — AB

## 2017-05-16 MED ORDER — AMOXICILLIN 875 MG PO TABS: 875.0000 mg | ORAL_TABLET | Freq: Two times a day (BID) | ORAL | 0 refills | Status: AC

## 2017-05-16 MED FILL — AMOXICILLIN 875 MG TABLET: 875 | 10 days supply | Qty: 20 | Fill #0

## 2017-05-16 NOTE — Progress Notes (Signed)
Subjective:  Patient ID: Kim Smith, female    DOB: Apr 17, 1995  Age: 22 y.o. MRN: [id number]  CC: Sore Throat   HPI Kim Smith presents for a 2-day history of sore throat, chills, odynophagia, and cervical lymphadenopathy.  Outpatient Medications Prior to Visit  Medication Sig Dispense Refill  . Vilazodone HCl (VIIBRYD STARTER PACK) 10 & 20 MG KIT Take 1 tablet by mouth daily. (Patient not taking: Reported on 05/16/2017) 1 kit 0   No facility-administered medications prior to visit.     ROS Review of Systems  Constitutional: Positive for chills. Negative for fatigue and fever.  HENT: Positive for sore throat and trouble swallowing. Negative for facial swelling, sinus pressure and voice change.   Eyes: Negative.   Respiratory: Negative.  Negative for cough, choking, chest tightness, shortness of breath, wheezing and stridor.   Cardiovascular: Negative for chest pain, palpitations and leg swelling.  Gastrointestinal: Negative.  Negative for abdominal pain, constipation, diarrhea, nausea and vomiting.  Genitourinary: Negative.  Negative for difficulty urinating.  Musculoskeletal: Negative.  Negative for back pain and neck pain.  Skin: Negative for color change, pallor and rash.  Neurological: Negative.  Negative for dizziness, weakness and light-headedness.  Hematological: Negative for adenopathy. Does not bruise/bleed easily.  Psychiatric/Behavioral: Negative.     Objective:  BP 124/84 (BP Location: Left Arm, Patient Position: Sitting, Cuff Size: Normal)   Pulse 98   Temp (!) 97.3 F (36.3 C) (Oral)   Resp 12   Ht '5\' 8"'  (1.727 m)   Wt 163 lb 0.6 oz (74 kg)   LMP  (LMP Unknown)   SpO2 98%   BMI 24.79 kg/m   BP Readings from Last 3 Encounters:  05/16/17 124/84  03/09/17 106/64  10/12/16 106/72    Wt Readings from Last 3 Encounters:  05/16/17 163 lb 0.6 oz (74 kg)  03/09/17 157 lb 12 oz (71.6 kg)  10/12/16 149 lb (67.6 kg)    Physical Exam    Constitutional: She is oriented to person, place, and time.  Non-toxic appearance. She does not have a sickly appearance. She does not appear ill. No distress.  HENT:  Mouth/Throat: Mucous membranes are not pale, not dry and not cyanotic. Oropharyngeal exudate and posterior oropharyngeal erythema present. No posterior oropharyngeal edema or tonsillar abscesses.  There is mild bilateral tonsillar hypertrophy with erythema and a scant exudate  Eyes: Conjunctivae are normal. Left eye exhibits no discharge. No scleral icterus.  Neck: Neck supple. No thyromegaly present.  Cardiovascular: Normal rate, regular rhythm and normal heart sounds. Exam reveals no gallop.  No murmur heard. Pulmonary/Chest: Effort normal and breath sounds normal. No stridor. No respiratory distress. She has no wheezes. She has no rales.  Abdominal: Soft. Bowel sounds are normal. She exhibits no mass. There is no tenderness. There is no guarding.  Musculoskeletal: Normal range of motion. She exhibits no edema or tenderness.  Lymphadenopathy:       Head (right side): No occipital adenopathy present.       Head (left side): No occipital adenopathy present.    She has cervical adenopathy.       Right cervical: Superficial cervical adenopathy present. No deep cervical and no posterior cervical adenopathy present.      Left cervical: Superficial cervical adenopathy present. No deep cervical and no posterior cervical adenopathy present.    She has no axillary adenopathy.       Right: No supraclavicular adenopathy present.       Left:  No supraclavicular adenopathy present.  Neurological: She is alert and oriented to person, place, and time.  Skin: Skin is warm and dry. No rash noted. She is not diaphoretic. No erythema. No pallor.  Vitals reviewed.   Lab Results  Component Value Date   WBC 9.5 09/26/2016   HGB 15.4 09/26/2016   HCT 46.3 09/26/2016   PLT 263 09/26/2016   GLUCOSE 89 09/26/2016   CHOL 167 11/05/2012   TRIG  87 11/05/2012   HDL 55 11/05/2012   LDLCALC 95 11/05/2012   ALT 20 09/26/2016   AST 22 09/26/2016   NA 141 09/26/2016   K 4.8 09/26/2016   CL 104 09/26/2016   CREATININE 0.64 09/26/2016   BUN 7 09/26/2016   CO2 23 09/26/2016   TSH 0.999 09/26/2016    US Abdomen Complete  Result Date: 06/13/2016 CLINICAL DATA:  Right upper quadrant pain for 2 months with nausea EXAM: ABDOMEN ULTRASOUND COMPLETE COMPARISON:  None. FINDINGS: Gallbladder: No gallstones or wall thickening visualized. No sonographic Murphy sign noted by sonographer. Common bile duct: Diameter: 2.3 mm. Liver: No focal lesion identified. Within normal limits in parenchymal echogenicity. IVC: No abnormality visualized. Pancreas: Visualized portion unremarkable. Spleen: Size and appearance within normal limits. Right Kidney: Length: 11 cm. Echogenicity within normal limits. No mass or hydronephrosis visualized. Left Kidney: Length: 11.9 cm. Echogenicity within normal limits. No mass or hydronephrosis visualized. Abdominal aorta: No aneurysm visualized. Other findings: None. IMPRESSION: No acute abnormality noted. Electronically Signed   By: Inez Catalina M.D.   On: 06/13/2016 12:27    Assessment & Plan:   Lexys was seen today for sore throat.  Diagnoses and all orders for this visit:  Strep sore throat -     amoxicillin (AMOXIL) 875 MG tablet; Take 1 tablet (875 mg total) by mouth 2 (two) times daily for 10 days.  Sore throat -     POCT rapid strep A   I have discontinued Cumi L. Monnin's Vilazodone HCl. I am also having her start on amoxicillin.  Meds ordered this encounter  Medications  . amoxicillin (AMOXIL) 875 MG tablet    Sig: Take 1 tablet (875 mg total) by mouth 2 (two) times daily for 10 days.    Dispense:  20 tablet    Refill:  0     Follow-up: Return if symptoms worsen or fail to improve.  Scarlette Calico, MD

## 2017-05-16 NOTE — Patient Instructions (Signed)
Strep Throat Strep throat is a bacterial infection of the throat. Your health care provider may call the infection tonsillitis or pharyngitis, depending on whether there is swelling in the tonsils or at the back of the throat. Strep throat is most common during the cold months of the year in children who are 5-21 years of age, but it can happen during any season in people of any age. This infection is spread from person to person (contagious) through coughing, sneezing, or close contact. What are the causes? Strep throat is caused by the bacteria called Streptococcus pyogenes. What increases the risk? This condition is more likely to develop in:  People who spend time in crowded places where the infection can spread easily.  People who have close contact with someone who has strep throat.  What are the signs or symptoms? Symptoms of this condition include:  Fever or chills.  Redness, swelling, or pain in the tonsils or throat.  Pain or difficulty when swallowing.  White or yellow spots on the tonsils or throat.  Swollen, tender glands in the neck or under the jaw.  Red rash all over the body (rare).  How is this diagnosed? This condition is diagnosed by performing a rapid strep test or by taking a swab of your throat (throat culture test). Results from a rapid strep test are usually ready in a few minutes, but throat culture test results are available after one or two days. How is this treated? This condition is treated with antibiotic medicine. Follow these instructions at home: Medicines  Take over-the-counter and prescription medicines only as told by your health care provider.  Take your antibiotic as told by your health care provider. Do not stop taking the antibiotic even if you start to feel better.  Have family members who also have a sore throat or fever tested for strep throat. They may need antibiotics if they have the strep infection. Eating and drinking  Do not  share food, drinking cups, or personal items that could cause the infection to spread to other people.  If swallowing is difficult, try eating soft foods until your sore throat feels better.  Drink enough fluid to keep your urine clear or pale yellow. General instructions  Gargle with a salt-water mixture 3-4 times per day or as needed. To make a salt-water mixture, completely dissolve -1 tsp of salt in 1 cup of warm water.  Make sure that all household members wash their hands well.  Get plenty of rest.  Stay home from school or work until you have been taking antibiotics for 24 hours.  Keep all follow-up visits as told by your health care provider. This is important. Contact a health care provider if:  The glands in your neck continue to get bigger.  You develop a rash, cough, or earache.  You cough up a thick liquid that is green, yellow-brown, or bloody.  You have pain or discomfort that does not get better with medicine.  Your problems seem to be getting worse rather than better.  You have a fever. Get help right away if:  You have new symptoms, such as vomiting, severe headache, stiff or painful neck, chest pain, or shortness of breath.  You have severe throat pain, drooling, or changes in your voice.  You have swelling of the neck, or the skin on the neck becomes red and tender.  You have signs of dehydration, such as fatigue, dry mouth, and decreased urination.  You become increasingly sleepy, or   you cannot wake up completely.  Your joints become red or painful. This information is not intended to replace advice given to you by your health care provider. Make sure you discuss any questions you have with your health care provider. Document Released: 02/05/2000 Document Revised: 10/07/2015 Document Reviewed: 06/02/2014 Elsevier Interactive Patient Education  2018 [employer]  

## 2018-03-06 ENCOUNTER — Encounter: Payer: Self-pay | Admitting: Internal Medicine

## 2018-03-06 ENCOUNTER — Other Ambulatory Visit (HOSPITAL_COMMUNITY)
Admission: RE | Admit: 2018-03-06 | Discharge: 2018-03-06 | Disposition: A | Discharge status: Home / Self Care | Payer: No Typology Code available for payment source | Source: Ambulatory Visit | Attending: Internal Medicine | Admitting: Internal Medicine

## 2018-03-06 ENCOUNTER — Ambulatory Visit (INDEPENDENT_AMBULATORY_CARE_PROVIDER_SITE_OTHER): Payer: No Typology Code available for payment source | Admitting: Internal Medicine

## 2018-03-06 ENCOUNTER — Other Ambulatory Visit: Payer: No Typology Code available for payment source

## 2018-03-06 VITALS — BP 122/72 | HR 98 | Temp 98.5°F | Resp 16 | Ht 68.0 in | Wt 177.5 lb

## 2018-03-06 DIAGNOSIS — F418 Other specified anxiety disorders: Secondary | ICD-10-CM

## 2018-03-06 DIAGNOSIS — Z124 Encounter for screening for malignant neoplasm of cervix: Secondary | ICD-10-CM | POA: Insufficient documentation

## 2018-03-06 DIAGNOSIS — Z7251 High risk heterosexual behavior: Secondary | ICD-10-CM | POA: Insufficient documentation

## 2018-03-06 DIAGNOSIS — Z0000 Encounter for general adult medical examination without abnormal findings: Secondary | ICD-10-CM

## 2018-03-06 MED ORDER — HYDROXYZINE HCL 10 MG PO TABS: 10.0000 mg | ORAL_TABLET | Freq: Three times a day (TID) | ORAL | 2 refills | Status: DC | PRN

## 2018-03-06 MED FILL — hydrOXYzine HCL 10 MG TABS: 10 | 30 days supply | Qty: 90 | Fill #0

## 2018-03-06 NOTE — Progress Notes (Signed)
Subjective:  Patient ID: Kim Smith, female    DOB: 1995-02-27  Age: 23 y.o. MRN: [id number]  CC: Annual Exam   HPI Kim Durieshley L Griffing presents for a CPX.  She is concerned about her sexual activity and wants to be screened for sexually transmitted infections.  She complains of anxiety and wants a refill of hydroxyzine.  She is not willing to take an antidepressant.  She otherwise feels well and offers no other complaints.  No outpatient medications prior to visit.   No facility-administered medications prior to visit.     ROS Review of Systems  Constitutional: Negative for chills, fatigue and fever.  HENT: Negative.  Negative for sore throat.   Eyes: Negative.   Respiratory: Negative.  Negative for cough, shortness of breath and wheezing.   Cardiovascular: Negative for chest pain and leg swelling.  Gastrointestinal: Negative for abdominal pain, constipation, diarrhea, nausea and vomiting.  Genitourinary: Negative.  Negative for difficulty urinating, dysuria, genital sores, hematuria, menstrual problem, pelvic pain, vaginal bleeding, vaginal discharge and vaginal pain.  Musculoskeletal: Negative.  Negative for arthralgias and myalgias.  Skin: Negative.  Negative for color change.  Neurological: Negative.  Negative for dizziness, weakness and headaches.  Hematological: Negative for adenopathy. Does not bruise/bleed easily.  Psychiatric/Behavioral: Positive for sleep disturbance. Negative for agitation, behavioral problems, confusion, decreased concentration, dysphoric mood, hallucinations, self-injury and suicidal ideas. The patient is nervous/anxious. The patient is not hyperactive.     Objective:  BP 122/72 (BP Location: Left Arm, Patient Position: Sitting, Cuff Size: Normal)   Pulse 98   Temp 98.5 F (36.9 C) (Oral)   Resp 16   Ht 5\' 8"  (1.727 m)   Wt 177 lb 8 oz (80.5 kg)   LMP  (LMP Unknown) Comment: MIRENA  SpO2 99%   BMI 26.99 kg/m   BP Readings from Last 3  Encounters:  03/06/18 122/72  05/16/17 124/84  03/09/17 106/64    Wt Readings from Last 3 Encounters:  03/06/18 177 lb 8 oz (80.5 kg)  05/16/17 163 lb 0.6 oz (74 kg)  03/09/17 157 lb 12 oz (71.6 kg)    Physical Exam Vitals signs reviewed. Exam conducted with a chaperone present.  Constitutional:      Appearance: Normal appearance.  HENT:     Nose: Nose normal. No congestion or rhinorrhea.     Mouth/Throat:     Mouth: Mucous membranes are moist.     Pharynx: Oropharynx is clear.  Eyes:     General: No scleral icterus.    Conjunctiva/sclera: Conjunctivae normal.  Neck:     Musculoskeletal: Normal range of motion and neck supple. No muscular tenderness.  Cardiovascular:     Rate and Rhythm: Normal rate and regular rhythm.     Heart sounds: No murmur. No gallop.   Pulmonary:     Effort: Pulmonary effort is normal.     Breath sounds: No stridor. No wheezing, rhonchi or rales.  Abdominal:     General: Bowel sounds are normal.     Palpations: There is no mass.     Tenderness: There is no abdominal tenderness. There is no guarding.     Hernia: No hernia is present. There is no hernia in the right inguinal area.  Genitourinary:    Exam position: Supine.     Pubic Area: No rash.      Labia:        Right: No rash, tenderness or lesion.        Left:  No rash, tenderness or lesion.      Urethra: No urethral pain or urethral swelling.     Vagina: Normal. No signs of injury and foreign body. No vaginal discharge, erythema, tenderness, bleeding, lesions or prolapsed vaginal walls.     Cervix: No cervical motion tenderness, discharge, lesion or cervical bleeding.     Uterus: Normal.      Adnexa:        Right: No mass, tenderness or fullness.         Left: No mass, tenderness or fullness.    Musculoskeletal: Normal range of motion.        General: No swelling.     Right lower leg: No edema.     Left lower leg: No edema.  Lymphadenopathy:     Lower Body: No right inguinal  adenopathy.  Skin:    General: Skin is warm and dry.     Findings: No rash.  Neurological:     General: No focal deficit present.     Mental Status: She is alert and oriented to person, place, and time. Mental status is at baseline.  Psychiatric:        Attention and Perception: Attention and perception normal.        Mood and Affect: Affect normal. Mood is anxious. Mood is not depressed. Affect is not flat or angry.        Speech: Speech normal. Speech is not rapid and pressured, delayed or tangential.        Behavior: Behavior normal. Behavior is not slowed or withdrawn. Behavior is cooperative.        Thought Content: Thought content normal. Thought content is not paranoid or delusional. Thought content does not include homicidal or suicidal ideation. Thought content does not include homicidal or suicidal plan.        Cognition and Memory: Cognition normal.        Judgment: Judgment normal.     Lab Results  Component Value Date   WBC 9.5 09/26/2016   HGB 15.4 09/26/2016   HCT 46.3 09/26/2016   PLT 263 09/26/2016   GLUCOSE 89 09/26/2016   CHOL 167 11/05/2012   TRIG 87 11/05/2012   HDL 55 11/05/2012   LDLCALC 95 11/05/2012   ALT 20 09/26/2016   AST 22 09/26/2016   NA 141 09/26/2016   K 4.8 09/26/2016   CL 104 09/26/2016   CREATININE 0.64 09/26/2016   BUN 7 09/26/2016   CO2 23 09/26/2016   TSH 0.999 09/26/2016    US Abdomen Complete  Result Date: 06/13/2016 CLINICAL DATA:  Right upper quadrant pain for 2 months with nausea EXAM: ABDOMEN ULTRASOUND COMPLETE COMPARISON:  None. FINDINGS: Gallbladder: No gallstones or wall thickening visualized. No sonographic Murphy sign noted by sonographer. Common bile duct: Diameter: 2.3 mm. Liver: No focal lesion identified. Within normal limits in parenchymal echogenicity. IVC: No abnormality visualized. Pancreas: Visualized portion unremarkable. Spleen: Size and appearance within normal limits. Right Kidney: Length: 11 cm. Echogenicity  within normal limits. No mass or hydronephrosis visualized. Left Kidney: Length: 11.9 cm. Echogenicity within normal limits. No mass or hydronephrosis visualized. Abdominal aorta: No aneurysm visualized. Other findings: None. IMPRESSION: No acute abnormality noted. Electronically Signed   By: Alcide Clever M.D.   On: 06/13/2016 12:27    Assessment & Plan:   Fiyinfoluwa was seen today for annual exam.  Diagnoses and all orders for this visit:  Screening for cervical cancer -     Cancel: Ambulatory referral to  Obstetrics / Gynecology  Cervical cancer screening -     Cytology - PAP; Future  High risk heterosexual behavior -     RPR; Future  Routine general medical examination at a health care facility- Exam completed, labs reviewed, vaccines reviewed and updated, patient education material was given. -     HIV Antibody (routine testing w rflx); Future  Depression with anxiety- She will take hydroxyzine as needed.  She is not willing to take an antidepressant. -     hydrOXYzine (ATARAX/VISTARIL) 10 MG tablet; Take 1 tablet (10 mg total) by mouth 3 (three) times daily as needed.   I am having Rmani L. Mcclane start on hydrOXYzine.  Meds ordered this encounter  Medications  . hydrOXYzine (ATARAX/VISTARIL) 10 MG tablet    Sig: Take 1 tablet (10 mg total) by mouth 3 (three) times daily as needed.    Dispense:  90 tablet    Refill:  2     Follow-up: Return if symptoms worsen or fail to improve.  Sanda Lingerhomas Sheyann Sulton, MD

## 2018-03-06 NOTE — Patient Instructions (Signed)
Preventive Care 18-39 Years, Female Preventive care refers to lifestyle choices and visits with your health care provider that can promote health and wellness. What does preventive care include?   A yearly physical exam. This is also called an annual well check.  Dental exams once or twice a year.  Routine eye exams. Ask your health care provider how often you should have your eyes checked.  Personal lifestyle choices, including: ? Daily care of your teeth and gums. ? Regular physical activity. ? Eating a healthy diet. ? Avoiding tobacco and drug use. ? Limiting alcohol use. ? Practicing safe sex. ? Taking vitamin and mineral supplements as recommended by your health care provider. What happens during an annual well check? The services and screenings done by your health care provider during your annual well check will depend on your age, overall health, lifestyle risk factors, and family history of disease. Counseling Your health care provider may ask you questions about your:  Alcohol use.  Tobacco use.  Drug use.  Emotional well-being.  Home and relationship well-being.  Sexual activity.  Eating habits.  Work and work environment.  Method of birth control.  Menstrual cycle.  Pregnancy history. Screening You may have the following tests or measurements:  Height, weight, and BMI.  Diabetes screening. This is done by checking your blood sugar (glucose) after you have not eaten for a while (fasting).  Blood pressure.  Lipid and cholesterol levels. These may be checked every 5 years starting at age 20.  Skin check.  Hepatitis C blood test.  Hepatitis B blood test.  Sexually transmitted disease (STD) testing.  BRCA-related cancer screening. This may be done if you have a family history of breast, ovarian, tubal, or peritoneal cancers.  Pelvic exam and Pap test. This may be done every 3 years starting at age 21. Starting at age 30, this may be done every 5  years if you have a Pap test in combination with an HPV test. Discuss your test results, treatment options, and if necessary, the need for more tests with your health care provider. Vaccines Your health care provider may recommend certain vaccines, such as:  Influenza vaccine. This is recommended every year.  Tetanus, diphtheria, and acellular pertussis (Tdap, Td) vaccine. You may need a Td booster every 10 years.  Varicella vaccine. You may need this if you have not been vaccinated.  HPV vaccine. If you are 26 or younger, you may need three doses over 6 months.  Measles, mumps, and rubella (MMR) vaccine. You may need at least one dose of MMR. You may also need a second dose.  Pneumococcal 13-valent conjugate (PCV13) vaccine. You may need this if you have certain conditions and were not previously vaccinated.  Pneumococcal polysaccharide (PPSV23) vaccine. You may need one or two doses if you smoke cigarettes or if you have certain conditions.  Meningococcal vaccine. One dose is recommended if you are age 19-21 years and a first-year college student living in a residence hall, or if you have one of several medical conditions. You may also need additional booster doses.  Hepatitis A vaccine. You may need this if you have certain conditions or if you travel or work in places where you may be exposed to hepatitis A.  Hepatitis B vaccine. You may need this if you have certain conditions or if you travel or work in places where you may be exposed to hepatitis B.  Haemophilus influenzae type b (Hib) vaccine. You may need this if you   have certain risk factors. Talk to your health care provider about which screenings and vaccines you need and how often you need them. This information is not intended to replace advice given to you by your health care provider. Make sure you discuss any questions you have with your health care provider. Document Released: 04/05/2001 Document Revised: 09/20/2016  Document Reviewed: 12/09/2014 Elsevier Interactive Patient Education  2019 [employer].

## 2018-03-07 LAB — CYTOLOGY - PAP
Adequacy: ABSENT
Chlamydia: NEGATIVE
Diagnosis: NEGATIVE
HPV: NOT DETECTED
Neisseria Gonorrhea: NEGATIVE

## 2018-03-07 LAB — RPR: RPR Ser Ql: NONREACTIVE

## 2018-03-07 LAB — HIV ANTIBODY (ROUTINE TESTING W REFLEX): HIV 1&2 Ab, 4th Generation: NONREACTIVE

## 2018-03-08 ENCOUNTER — Encounter: Payer: Self-pay | Admitting: Internal Medicine

## 2018-04-10 ENCOUNTER — Ambulatory Visit (INDEPENDENT_AMBULATORY_CARE_PROVIDER_SITE_OTHER): Payer: No Typology Code available for payment source | Admitting: Internal Medicine

## 2018-04-10 ENCOUNTER — Encounter: Payer: Self-pay | Admitting: Internal Medicine

## 2018-04-10 DIAGNOSIS — B9689 Other specified bacterial agents as the cause of diseases classified elsewhere: Secondary | ICD-10-CM | POA: Insufficient documentation

## 2018-04-10 DIAGNOSIS — N760 Acute vaginitis: Secondary | ICD-10-CM

## 2018-04-10 MED ORDER — METRONIDAZOLE 500 MG PO TABS: 500.0000 mg | ORAL_TABLET | Freq: Two times a day (BID) | ORAL | 0 refills | Status: DC

## 2018-04-10 MED FILL — metroNIDAZOLE 500 MG TABS: 500 | 7 days supply | Qty: 14 | Fill #0

## 2018-04-10 NOTE — Patient Instructions (Signed)
We have sent in the flagyl (metronidazole) to take 1 pill twice a day for 1 week.

## 2018-04-10 NOTE — Assessment & Plan Note (Signed)
Rx for flagyl 500 mg BID 7 day course and advised on potential side effects. Avoidance of alcohol while on treatment avoided.

## 2018-04-10 NOTE — Progress Notes (Signed)
   Subjective:   Patient ID: Kim Smith, female    DOB: 12/16/1995, 23 y.o.   MRN: [id number]  HPI The patient is a 23 YO female coming in for possible BV. Had pap smear and testing done on 03/06/2018 which showed likely BV. She was not treated at this time. She is wanting treatment for peace of mind. Denies drainage or odor. Has no other symptoms.   Review of Systems  Constitutional: Negative.   Respiratory: Negative for cough, chest tightness and shortness of breath.   Cardiovascular: Negative for chest pain, palpitations and leg swelling.  Gastrointestinal: Negative for abdominal distention, abdominal pain, constipation, diarrhea, nausea and vomiting.  Genitourinary: Negative.   Musculoskeletal: Negative.   Skin: Negative.   Neurological: Negative.   Psychiatric/Behavioral: Negative.     Objective:  Physical Exam Constitutional:      Appearance: She is well-developed.  HENT:     Head: Normocephalic and atraumatic.  Neck:     Musculoskeletal: Normal range of motion.  Cardiovascular:     Rate and Rhythm: Normal rate and regular rhythm.  Pulmonary:     Effort: Pulmonary effort is normal. No respiratory distress.     Breath sounds: Normal breath sounds. No wheezing or rales.  Abdominal:     General: Bowel sounds are normal. There is no distension.     Palpations: Abdomen is soft.     Tenderness: There is no abdominal tenderness. There is no rebound.  Genitourinary:    Comments: Exam deferred Skin:    General: Skin is warm and dry.  Neurological:     Mental Status: She is alert and oriented to person, place, and time.     Coordination: Coordination normal.     Vitals:   04/10/18 0904  BP: 118/70  Pulse: 73  Temp: 98.2 F (36.8 C)  TempSrc: Oral  SpO2: 98%  Weight: 181 lb (82.1 kg)  Height: 5\' 8"  (1.727 m)    Assessment & Plan:

## 2018-07-12 ENCOUNTER — Other Ambulatory Visit: Payer: Self-pay | Admitting: Internal Medicine

## 2018-09-24 ENCOUNTER — Other Ambulatory Visit: Payer: Self-pay | Admitting: Internal Medicine

## 2018-10-12 ENCOUNTER — Telehealth: Payer: No Typology Code available for payment source | Admitting: Physician Assistant

## 2018-10-12 DIAGNOSIS — N898 Other specified noninflammatory disorders of vagina: Secondary | ICD-10-CM

## 2018-10-12 MED ORDER — METRONIDAZOLE 500 MG PO TABS: 500.0000 mg | ORAL_TABLET | Freq: Two times a day (BID) | ORAL | 0 refills | Status: DC

## 2018-10-12 MED FILL — METRONIDAZOLE 500 MG TABS: 500 | 7 days supply | Qty: 14 | Fill #0

## 2018-10-12 NOTE — Progress Notes (Addendum)
We are sorry that you are not feeling well. Here is how we plan to help!  Based on what you shared with me it looks like you: May have a vaginosis due to bacteria   If your symptoms seem similar to your previous bacterial vaginosis it is ok to treat it with metronidazole. If you have any pelvic/abdominal pain, fever, purulent discharge, new sexual partners, suspicion for STD or any other concerning findings its important that you follow up in person for evaluation. Also if your symptoms do not improve with medication please follow up. Do not drink alcohol while taking this medication. You responded that you are not pregnant but have not had a menstrual cycle in awhile. I would recommend taking an at home pregnancy test to ensure you are not pregnant. Please read the information below on how to prevent BV infections. It would be a good idea to follow up with an OBGYN for a more in dept discussion of re-occuring BV infections.   Vaginosis is an inflammation of the vagina that can result in discharge, itching and pain. The cause is usually a change in the normal balance of vaginal bacteria or an infection. Vaginosis can also result from reduced estrogen levels after menopause.  The most common causes of vaginosis are:   Bacterial vaginosis which results from an overgrowth of one on several organisms that are normally present in your vagina.   Yeast infections which are caused by a naturally occurring fungus called candida.   Vaginal atrophy (atrophic vaginosis) which results from the thinning of the vagina from reduced estrogen levels after menopause.   Trichomoniasis which is caused by a parasite and is commonly transmitted by sexual intercourse.  Factors that increase your risk of developing vaginosis include: Marland Kitchen Medications, such as antibiotics and steroids . Uncontrolled diabetes . Use of hygiene products such as bubble bath, vaginal spray or vaginal deodorant . Douching . Wearing damp or  tight-fitting clothing . Using an intrauterine device (IUD) for birth control . Hormonal changes, such as those associated with pregnancy, birth control pills or menopause . Sexual activity . Having a sexually transmitted infection  Your treatment plan is Metronidazole or Flagyl 500mg  twice a day for 7 days.  I have electronically sent this prescription into the pharmacy that you have chosen.  Be sure to take all of the medication as directed. Stop taking any medication if you develop a rash, tongue swelling or shortness of breath. Mothers who are breast feeding should consider pumping and discarding their breast milk while on these antibiotics. However, there is no consensus that infant exposure at these doses would be harmful.  Remember that medication creams can weaken latex condoms. Marland Kitchen   HOME CARE:  Good hygiene may prevent some types of vaginosis from recurring and may relieve some symptoms:  . Avoid baths, hot tubs and whirlpool spas. Rinse soap from your outer genital area after a shower, and dry the area well to prevent irritation. Don't use scented or harsh soaps, such as those with deodorant or antibacterial action. Marland Kitchen Avoid irritants. These include scented tampons and pads. . Wipe from front to back after using the toilet. Doing so avoids spreading fecal bacteria to your vagina.  Other things that may help prevent vaginosis include:  Marland Kitchen Don't douche. Your vagina doesn't require cleansing other than normal bathing. Repetitive douching disrupts the normal organisms that reside in the vagina and can actually increase your risk of vaginal infection. Douching won't clear up a vaginal infection. Marland Kitchen  Use a latex condom. Both female and female latex condoms may help you avoid infections spread by sexual contact. . Wear cotton underwear. Also wear pantyhose with a cotton crotch. If you feel comfortable without it, skip wearing underwear to bed. Yeast thrives in [employer] Hotelsmoist environments Your symptoms  should improve in the next day or two.  GET HELP RIGHT AWAY IF:  . You have pain in your lower abdomen ( pelvic area or over your ovaries) . You develop nausea or vomiting . You develop a fever . Your discharge changes or worsens . You have persistent pain with intercourse . You develop shortness of breath, a rapid pulse, or you faint.  These symptoms could be signs of problems or infections that need to be evaluated by a medical provider now.  MAKE SURE YOU    Understand these instructions.  Will watch your condition.  Will get help right away if you are not doing well or get worse.  Your e-visit answers were reviewed by a board certified advanced clinical practitioner to complete your personal care plan. Depending upon the condition, your plan could have included both over the counter or prescription medications. Please review your pharmacy choice to make sure that you have choses a pharmacy that is open for you to pick up any needed prescription, Your safety is important to us. If you have drug allergies check your prescription carefully.   You can use MyChart to ask questions about today's visit, request a non-urgent call back, or ask for a work or school excuse for 24 hours related to this e-Visit. If it has been greater than 24 hours you will need to follow up with your provider, or enter a new e-Visit to address those concerns. You will get a MyChart message within the next two days asking about your experience. I hope that your e-visit has been valuable and will speed your recovery.  Greater than 5 minutes, yet less than 10 minutes of time have been spent researching, coordinating, and implementing care for this patient today

## 2018-10-17 ENCOUNTER — Other Ambulatory Visit: Payer: Self-pay

## 2018-10-17 ENCOUNTER — Encounter: Payer: Self-pay | Admitting: Internal Medicine

## 2018-10-17 ENCOUNTER — Ambulatory Visit (INDEPENDENT_AMBULATORY_CARE_PROVIDER_SITE_OTHER): Payer: No Typology Code available for payment source | Admitting: Internal Medicine

## 2018-10-17 ENCOUNTER — Ambulatory Visit (INDEPENDENT_AMBULATORY_CARE_PROVIDER_SITE_OTHER)
Admission: RE | Admit: 2018-10-17 | Discharge: 2018-10-17 | Disposition: A | Discharge status: Home / Self Care | Payer: No Typology Code available for payment source | Source: Ambulatory Visit | Attending: Internal Medicine | Admitting: Internal Medicine

## 2018-10-17 VITALS — BP 112/78 | HR 89 | Temp 98.3°F | Resp 16 | Ht 68.0 in | Wt 172.8 lb

## 2018-10-17 DIAGNOSIS — M542 Cervicalgia: Secondary | ICD-10-CM

## 2018-10-17 DIAGNOSIS — N760 Acute vaginitis: Secondary | ICD-10-CM

## 2018-10-17 DIAGNOSIS — B9689 Other specified bacterial agents as the cause of diseases classified elsewhere: Secondary | ICD-10-CM | POA: Diagnosis not present

## 2018-10-17 DIAGNOSIS — M62838 Other muscle spasm: Secondary | ICD-10-CM

## 2018-10-17 DIAGNOSIS — G8929 Other chronic pain: Secondary | ICD-10-CM | POA: Diagnosis not present

## 2018-10-17 MED ORDER — METRONIDAZOLE 500 MG PO TABS: 500.0000 mg | ORAL_TABLET | Freq: Two times a day (BID) | ORAL | 0 refills | Status: AC

## 2018-10-17 NOTE — Progress Notes (Signed)
Subjective:  Patient ID: Kim Smith, female    DOB: 05-19-1995  Age: 23 y.o. MRN: [id number]  CC: Neck Pain and Vaginal Discharge   HPI VALERIYA AUTON presents for f/up-  1. She complains of a several year history of nontraumatic posterior midline neck pain.  There is no history of trauma.  The pain does not radiate towards her extremities and she denies paresthesias.  She has not taken anything to control the discomfort.  2. She also complains of a several week history of cloudy, itchy vaginal discharge.  She has a history of bacterial vaginosis.  Outpatient Medications Prior to Visit  Medication Sig Dispense Refill  . hydrOXYzine (ATARAX/VISTARIL) 10 MG tablet Take 1 tablet (10 mg total) by mouth 3 (three) times daily as needed. 90 tablet 2  . levonorgestrel (MIRENA) 20 MCG/24HR IUD 1 each by Intrauterine route once.    . metroNIDAZOLE (FLAGYL) 500 MG tablet Take 1 tablet (500 mg total) by mouth 2 (two) times daily. 14 tablet 0   No facility-administered medications prior to visit.     ROS Review of Systems  Constitutional: Negative.  Negative for chills, fatigue and fever.  HENT: Negative.   Eyes: Negative for visual disturbance.  Respiratory: Negative for cough, chest tightness, shortness of breath and wheezing.   Cardiovascular: Negative for chest pain, palpitations and leg swelling.  Gastrointestinal: Negative for abdominal pain, constipation, diarrhea, nausea and vomiting.  Endocrine: Negative.   Genitourinary: Positive for vaginal discharge. Negative for difficulty urinating, genital sores, hematuria, pelvic pain, vaginal bleeding and vaginal pain.  Musculoskeletal: Positive for neck pain and neck stiffness. Negative for arthralgias and myalgias.  Skin: Negative.  Negative for color change.  Neurological: Negative.  Negative for dizziness, weakness and light-headedness.  Hematological: Negative for adenopathy. Does not bruise/bleed easily.  Psychiatric/Behavioral:  Negative.     Objective:  BP 112/78 (BP Location: Left Arm, Patient Position: Sitting, Cuff Size: Normal)   Pulse 89   Temp 98.3 F (36.8 C) (Oral)   Resp 16   Ht 5\' 8"  (1.727 m)   Wt 172 lb 12 oz (78.4 kg)   SpO2 98%   BMI 26.27 kg/m   BP Readings from Last 3 Encounters:  10/17/18 112/78  04/10/18 118/70  03/06/18 122/72    Wt Readings from Last 3 Encounters:  10/17/18 172 lb 12 oz (78.4 kg)  04/10/18 181 lb (82.1 kg)  03/06/18 177 lb 8 oz (80.5 kg)    Physical Exam Vitals signs reviewed.  Constitutional:      Appearance: Normal appearance.  HENT:     Nose: Nose normal.     Mouth/Throat:     Mouth: Mucous membranes are moist.  Eyes:     Conjunctiva/sclera: Conjunctivae normal.  Neck:     Musculoskeletal: Normal range of motion and neck supple. No neck rigidity or muscular tenderness.  Cardiovascular:     Rate and Rhythm: Normal rate and regular rhythm.     Heart sounds: No murmur.  Pulmonary:     Effort: Pulmonary effort is normal.     Breath sounds: No stridor. No wheezing, rhonchi or rales.  Abdominal:     General: Abdomen is flat. Bowel sounds are normal. There is no distension.     Palpations: There is no hepatomegaly or splenomegaly.     Tenderness: There is no abdominal tenderness.  Genitourinary:    Comments: She refused a pelvic exam. Musculoskeletal: Normal range of motion.     Cervical back: She  exhibits spasm. She exhibits normal range of motion, no tenderness, no swelling, no edema, no deformity and no pain.     Right lower leg: No edema.     Left lower leg: No edema.  Skin:    General: Skin is warm and dry.     Coloration: Skin is not pale.  Neurological:     General: No focal deficit present.     Mental Status: She is alert and oriented to person, place, and time. Mental status is at baseline.     Cranial Nerves: Cranial nerves are intact.     Sensory: Sensation is intact.     Motor: Motor function is intact. No weakness.      Coordination: Coordination is intact.     Deep Tendon Reflexes: Reflexes normal.     Lab Results  Component Value Date   WBC 9.5 09/26/2016   HGB 15.4 09/26/2016   HCT 46.3 09/26/2016   PLT 263 09/26/2016   GLUCOSE 89 09/26/2016   CHOL 167 11/05/2012   TRIG 87 11/05/2012   HDL 55 11/05/2012   LDLCALC 95 11/05/2012   ALT 20 09/26/2016   AST 22 09/26/2016   NA 141 09/26/2016   K 4.8 09/26/2016   CL 104 09/26/2016   CREATININE 0.64 09/26/2016   BUN 7 09/26/2016   CO2 23 09/26/2016   TSH 0.999 09/26/2016    Dg Cervical Spine Complete  Result Date: 10/17/2018 CLINICAL DATA:  Chronic posterior neck pain, worsened recently. No known injury. EXAM: CERVICAL SPINE - COMPLETE 4+ VIEW COMPARISON:  None. FINDINGS: C1 to the superior endplate of C7 is imaged on the provided lateral radiograph, however there is adequate visualization of the cervicothoracic junction on the provided swimmer's radiograph. Straightening and slight reversal of the expected cervical lordosis with mild kyphosis centered about the C4-C5 articulation. The dens appears normally positioned between the lateral masses of C1. The bilateral facets appear normally aligned. Cervical vertebral body heights are preserved. Prevertebral soft tissues are normal. Cervical intervertebral disc space heights are preserved. The bilateral neural foramina appear widely patent given obliquity. Regional soft tissues appear normal. Limited visualization of the lung apices is normal. IMPRESSION: Straightening and slight reversal of the expected cervical lordosis, nonspecific though could be seen in the setting of muscle spasm. Otherwise, no explanation for patient's acute on chronic neck pain. Electronically Signed   By: Sandi Mariscal M.D.   On: 10/17/2018 21:56    Assessment & Plan:   Gaye was seen today for neck pain and vaginal discharge.  Diagnoses and all orders for this visit:  BV (bacterial vaginosis)- Wet prep and screening for GC and  chlamydia was not performed because she was not willing to do a pelvic exam.  Will empirically treat with metronidazole. -     metroNIDAZOLE (FLAGYL) 500 MG tablet; Take 1 tablet (500 mg total) by mouth 2 (two) times daily for 7 days.  Neck pain, chronic -     DG Cervical Spine Complete; Future -     Ambulatory referral to Physical Therapy  Muscle spasms of neck -     cyclobenzaprine (FLEXERIL) 5 MG tablet; Take 1 tablet (5 mg total) by mouth 3 (three) times daily as needed for muscle spasms. -     Ambulatory referral to Physical Therapy   I have discontinued Caryl Pina L. Tomeo "Ash"'s metroNIDAZOLE. I am also having her start on metroNIDAZOLE and cyclobenzaprine. Additionally, I am having her maintain her hydrOXYzine and levonorgestrel.  Meds ordered this  encounter  Medications  . metroNIDAZOLE (FLAGYL) 500 MG tablet    Sig: Take 1 tablet (500 mg total) by mouth 2 (two) times daily for 7 days.    Dispense:  14 tablet    Refill:  0  . cyclobenzaprine (FLEXERIL) 5 MG tablet    Sig: Take 1 tablet (5 mg total) by mouth 3 (three) times daily as needed for muscle spasms.    Dispense:  45 tablet    Refill:  1     Follow-up: Return in about 3 weeks (around 11/07/2018).  Sanda Lingerhomas Krishika Bugge, MD

## 2018-10-17 NOTE — Patient Instructions (Signed)
Bacterial Vaginosis  Bacterial vaginosis is a vaginal infection that occurs when the normal balance of bacteria in the vagina is disrupted. It results from an overgrowth of certain bacteria. This is the most common vaginal infection among women ages 15-44. Because bacterial vaginosis increases your risk for STIs (sexually transmitted infections), getting treated can help reduce your risk for chlamydia, gonorrhea, herpes, and HIV (human immunodeficiency virus). Treatment is also important for preventing complications in pregnant women, because this condition can cause an early (premature) delivery. What are the causes? This condition is caused by an increase in harmful bacteria that are normally present in small amounts in the vagina. However, the reason that the condition develops is not fully understood. What increases the risk? The following factors may make you more likely to develop this condition:  Having a new sexual partner or multiple sexual partners.  Having unprotected sex.  Douching.  Having an intrauterine device (IUD).  Smoking.  Drug and alcohol abuse.  Taking certain antibiotic medicines.  Being pregnant. You cannot get bacterial vaginosis from toilet seats, bedding, swimming pools, or contact with objects around you. What are the signs or symptoms? Symptoms of this condition include:  Grey or white vaginal discharge. The discharge can also be watery or foamy.  A fish-like odor with discharge, especially after sexual intercourse or during menstruation.  Itching in and around the vagina.  Burning or pain with urination. Some women with bacterial vaginosis have no signs or symptoms. How is this diagnosed? This condition is diagnosed based on:  Your medical history.  A physical exam of the vagina.  Testing a sample of vaginal fluid under a microscope to look for a large amount of bad bacteria or abnormal cells. Your health care provider may use a cotton swab or  a small wooden spatula to collect the sample. How is this treated? This condition is treated with antibiotics. These may be given as a pill, a vaginal cream, or a medicine that is put into the vagina (suppository). If the condition comes back after treatment, a second round of antibiotics may be needed. Follow these instructions at home: Medicines  Take over-the-counter and prescription medicines only as told by your health care provider.  Take or use your antibiotic as told by your health care provider. Do not stop taking or using the antibiotic even if you start to feel better. General instructions  If you have a female sexual partner, tell her that you have a vaginal infection. She should see her health care provider and be treated if she has symptoms. If you have a female sexual partner, he does not need treatment.  During treatment: ? Avoid sexual activity until you finish treatment. ? Do not douche. ? Avoid alcohol as directed by your health care provider. ? Avoid breastfeeding as directed by your health care provider.  Drink enough water and fluids to keep your urine clear or pale yellow.  Keep the area around your vagina and rectum clean. ? Wash the area daily with warm water. ? Wipe yourself from front to back after using the toilet.  Keep all follow-up visits as told by your health care provider. This is important. How is this prevented?  Do not douche.  Wash the outside of your vagina with warm water only.  Use protection when having sex. This includes latex condoms and dental dams.  Limit how many sexual partners you have. To help prevent bacterial vaginosis, it is best to have sex with just one partner (  monogamous).  Make sure you and your sexual partner are tested for STIs.  Wear cotton or cotton-lined underwear.  Avoid wearing tight pants and pantyhose, especially during summer.  Limit the amount of alcohol that you drink.  Do not use any products that contain  nicotine or tobacco, such as cigarettes and e-cigarettes. If you need help quitting, ask your health care provider.  Do not use illegal drugs. Where to find more information  Centers for Disease Control and Prevention: www.cdc.gov/std  American Sexual Health Association (ASHA): www.ashastd.org  U.S. Department of Health and Human Services, Office on Women's Health: www.womenshealth.gov/ or https://www.womenshealth.gov/a-z-topics/bacterial-vaginosis Contact a health care provider if:  Your symptoms do not improve, even after treatment.  You have more discharge or pain when urinating.  You have a fever.  You have pain in your abdomen.  You have pain during sex.  You have vaginal bleeding between periods. Summary  Bacterial vaginosis is a vaginal infection that occurs when the normal balance of bacteria in the vagina is disrupted.  Because bacterial vaginosis increases your risk for STIs (sexually transmitted infections), getting treated can help reduce your risk for chlamydia, gonorrhea, herpes, and HIV (human immunodeficiency virus). Treatment is also important for preventing complications in pregnant women, because the condition can cause an early (premature) delivery.  This condition is treated with antibiotic medicines. These may be given as a pill, a vaginal cream, or a medicine that is put into the vagina (suppository). This information is not intended to replace advice given to you by your health care provider. Make sure you discuss any questions you have with your health care provider. Document Released: 02/07/2005 Document Revised: 01/20/2017 Document Reviewed: 10/24/2015 Elsevier Patient Education  2020 Elsevier Inc.  

## 2018-10-18 ENCOUNTER — Encounter: Payer: Self-pay | Admitting: Internal Medicine

## 2018-10-18 DIAGNOSIS — M62838 Other muscle spasm: Secondary | ICD-10-CM | POA: Insufficient documentation

## 2018-10-18 DIAGNOSIS — M542 Cervicalgia: Secondary | ICD-10-CM | POA: Insufficient documentation

## 2018-10-18 DIAGNOSIS — G8929 Other chronic pain: Secondary | ICD-10-CM | POA: Insufficient documentation

## 2018-10-18 MED ORDER — CYCLOBENZAPRINE HCL 5 MG PO TABS: 5.0000 mg | ORAL_TABLET | Freq: Three times a day (TID) | ORAL | 1 refills | Status: DC | PRN

## 2018-10-18 MED FILL — CYCLOBENZAPRINE HCL 5 MG TA: 5 | 15 days supply | Qty: 45 | Fill #0

## 2018-11-12 ENCOUNTER — Ambulatory Visit: Payer: No Typology Code available for payment source | Attending: Internal Medicine | Admitting: Physical Therapy

## 2018-11-12 ENCOUNTER — Other Ambulatory Visit: Payer: Self-pay

## 2018-11-12 ENCOUNTER — Encounter: Payer: Self-pay | Admitting: Physical Therapy

## 2018-11-12 DIAGNOSIS — M62838 Other muscle spasm: Secondary | ICD-10-CM | POA: Diagnosis present

## 2018-11-12 DIAGNOSIS — R293 Abnormal posture: Secondary | ICD-10-CM | POA: Diagnosis present

## 2018-11-12 DIAGNOSIS — M6281 Muscle weakness (generalized): Secondary | ICD-10-CM

## 2018-11-12 DIAGNOSIS — M542 Cervicalgia: Secondary | ICD-10-CM

## 2018-11-12 NOTE — Therapy (Addendum)
[facility] [address]  [place], [place], [address] Phone: [phone]   Fax:  [phone]  Physical Therapy Evaluation / Discharge Summary  Patient Details  Name: Kim Smith MRN: [id number] Date of Birth: 1995/05/28 Referring Provider (PT): Janith Lima, MD   Encounter Date: 11/12/2018  PT End of Session - 11/12/18 1017    Visit Number  1    Number of Visits  12    Date for PT Re-Evaluation  12/24/18    Authorization Type  [facility]    PT Start Time  1017    PT Stop Time  1106    PT Time Calculation (min)  49 min    Activity Tolerance  Patient tolerated treatment well    Behavior During Therapy  Kim Smith for tasks assessed/performed       Past Medical History:  Diagnosis Date  . Anxiety     Past Surgical History:  Procedure Laterality Date  . NO PAST SURGERIES  01/20/15    There were no vitals filed for this visit.   Subjective Assessment - 11/12/18 1019    Subjective  Pt reports she has had a "stress lump" on the back of her neck for 2-3 yrs. When aggravated by sleeping position, her neck will stay flared up with "locked" sensation in her neck for several days. Has tried massage but feels like it made muscle spasms worse. No benefit noted from currently prescribed muscle relaxants.    Limitations  Standing    How long can you stand comfortably?  requires freqent postural self-checks    Diagnostic tests  Cervical x-ray 10/17/18: Straightening and slight reversal of the expected cervical lordosis, nonspecific though could be seen in the setting of muscle spasm.    Patient Stated Goals  "to do something about the lump in my neck"    Currently in Pain?  Yes    Pain Score  2    up to 7-8/10 at worst   Pain Location  Neck    Pain Orientation  Lower;Right;Left    Pain Descriptors / Indicators  Spasm;Throbbing    Pain Type  Acute pain;Chronic pain    Pain Radiating Towards  into B upper shoulders & along  spine to mid back    Pain Onset  Other (comment)   2-3 years   Pain Frequency  Intermittent    Aggravating Factors   unknown; will usually wake up with pain    Pain Relieving Factors  hot & cold packs - preferes hot pack; Advil    Effect of Pain on Daily Activities  makes it diffilcult to stand up straight; tends to turn body rather than neck         Kim Smith PT Assessment - 11/12/18 1017      Assessment   Medical Diagnosis  Chronic neck pain with muscle spasms    Referring Provider (PT)  Janith Lima, MD    Onset Date/Surgical Date  --   several years   Hand Dominance  Right    Next MD Visit  none scheduled    Prior Therapy  none       Precautions   Precautions  None      Balance Screen   Has the patient fallen in the past 6 months  Yes    How many times?  1   slip and fall at work   Has the patient had a decrease in activity level because  of a fear of falling?   No    Is the patient reluctant to leave their home because of a fear of falling?   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      Cognition   Overall Cognitive Status  Within Functional Limits for tasks assessed      Observation/Other Assessments   Focus on Therapeutic Outcomes (FOTO)   Neck - 67% (33% limitation); Predicted 75% (25% limitation)      Posture/Postural Control   Posture/Postural Control  Postural limitations    Postural Limitations  Forward head;Rounded Shoulders;Decreased lumbar lordosis      ROM / Strength   AROM / PROM / Strength  AROM;Strength      AROM   Overall AROM Comments  B shoulder AROM WNL but slight "strain" reported with FER    AROM Assessment Site  Cervical;Shoulder    Cervical Flexion  49    Cervical Extension  44    Cervical - Right Side Bend  25     Cervical - Left Side Bend  31    Cervical - Right Rotation  59    Cervical - Left Rotation  50      Strength   Strength Assessment Site  Shoulder    Right/Left Shoulder  Right;Left    Right Shoulder Flexion  4+/5    Right Shoulder ABduction  4+/5    Right Shoulder Internal Rotation  4+/5    Right Shoulder External Rotation  4-/5    Left Shoulder Flexion  4+/5    Left Shoulder ABduction  4+/5    Left Shoulder Internal Rotation  4+/5    Left Shoulder External Rotation  4/5      Palpation   Palpation comment  increased muscle tension with ttp over B UT & LS (L>R)                Objective measurements completed on examination: See above findings.      Kim Smith Adult PT Treatment/Exercise - 11/12/18 1017      Exercises   Exercises  Neck      Neck Exercises: Theraband   Shoulder External Rotation  10 reps;Red    Shoulder External Rotation Limitations  hooklying with cues for scap retraciton    Horizontal ABduction  10 reps;Red    Horizontal ABduction Limitations  hooklying with cues for scap retraciton      Neck Exercises: Seated   Neck Retraction  10 reps;5 secs      Manual Therapy   Manual Therapy  Soft tissue mobilization;Myofascial release;Passive ROM    Soft tissue mobilization  B UT/LS    Myofascial Release  manual TPR to L UT    Passive ROM  gentle cervical PROM with manual stretch to B UT & LS      Neck Exercises: Stretches   Upper Trapezius Stretch  Right;Left;30 seconds;1 rep    Levator Stretch  Right;Left;30 seconds;1 rep    Corner Stretch  30 seconds;3 reps    Corner Stretch Limitations  3-way doorway stretch             PT Education - 11/12/18 1104    Education Details  PT  eval findings, anticipated POC, initial postural instruction & initial HEP    Person(s) Educated  Patient    Methods  Explanation;Demonstration;Handout    Comprehension  Verbalized understanding;Returned demonstration;Need further instruction       PT Short Term Goals  - 11/12/18 1106      PT SHORT TERM GOAL #1   Title  Patient will be independent with initial HEP    Status  New    Target Date  11/26/18      PT SHORT TERM GOAL #2   Title  Patient will verbalize/demonstrate understanding of neutral spine posture and proper body mechanics to reduce strain on cervical spine    Status  New    Target Date  11/26/18        PT Long Term Goals - 11/12/18 1106      PT LONG TERM GOAL #1   Title  Patient will be independent with ongoing/advanced HEP    Status  New    Target Date  12/24/18      PT LONG TERM GOAL #2   Title  Patient to demonstrate appropriate posture and body mechanics needed for daily activities    Status  New    Target Date  12/24/18      PT LONG TERM GOAL #3   Title  Patient to improve cervical AROM to WNL/WFL without pain provocation    Status  New    Target Date  12/24/18      PT LONG TERM GOAL #4   Title  Patient to report ability to perform ADLs, household and work-related tasks without increased pain    Status  New    Target Date  12/24/18      PT LONG TERM GOAL #5   Title  Patient to report pain and muscle spasm reduction in frequency and intensity by >/= 50%    Status  New    Target Date  12/24/18             Plan - 11/12/18 1106    Clinical Impression Statement  Kim Smith is a 23 y/o female who presents to OP PT for chronic neck pain with muscle spasms w/o known MOI but ongoing for at least 2-3 years. No relief from massage or muscle relaxants. Neck pain intermittent and localized to B lower neck and upper shoulders. Forward head and rounded shoulder posture evident with L shoulder slightly elevated relative to R. Cervical ROM limited in all planes with B shoulder AROM WNL however mild B shoulder weakness present in ER, L>R.  Increased muscle tension and ttp present throughout B neck and shoulder musculature, L>R. Pain and limited cervical motion make it feel difficult to stand up straight and cause patient to turn her  whole body rather than just her neck. Kim Smith will benefit from skilled PT intervention to address above deficits and current functional limitations as well as to decrease pain interference with daily activities.    Personal Factors and Comorbidities  Comorbidity 1;Time since onset of injury/illness/exacerbation;Past/Current Experience    Comorbidities  anxiety & depression    Examination-Activity Limitations  Sleep;Stand;Carry;Lift;Reach Overhead    Examination-Participation Restrictions  Other   work   Stability/Clinical Decision Making  Stable/Uncomplicated    Clinical Decision Making  Low    Rehab Potential  Fair    PT Frequency  2x / week    PT Duration  6 weeks    PT Treatment/Interventions  ADLs/Self Care Home Management;Cryotherapy;Electrical Stimulation;Iontophoresis 42m/ml Dexamethasone;Moist Heat;Traction;Ultrasound;Functional mobility training;Therapeutic  activities;Therapeutic exercise;Neuromuscular re-education;Patient/family education;Manual techniques;Passive range of motion;Dry needling;Taping;Spinal Manipulations    PT Next Visit Plan  Review initial HEP; expand on posture & body mechanics education; postural stretching & strengthening; manual therapy & modalities as indicated    PT Home Exercise Plan  11/12/18 - cervical retraction, UT/LS stretches, 3-way doorway stretch, hookying horiz abd & ER with red TB    Consulted and Agree with Plan of Care  Patient       Patient will benefit from skilled therapeutic intervention in order to improve the following deficits and impairments:  Decreased activity tolerance, Decreased mobility, Decreased range of motion, Decreased strength, Hypomobility, Increased muscle spasms, Impaired flexibility, Impaired UE functional use, Improper body mechanics, Postural dysfunction, Pain  Visit Diagnosis: Cervicalgia  Other muscle spasm  Abnormal posture  Muscle weakness (generalized)     Problem List Patient Active Problem List   Diagnosis  Date Noted  . Neck pain, chronic 10/18/2018  . Muscle spasms of neck 10/18/2018  . BV (bacterial vaginosis) 04/10/2018  . Routine general medical examination at a health care facility 03/06/2018  . Depression with anxiety 09/26/2016  . Perennial and seasonal allergic rhinitis 01/20/2015  . Allergy history, anesthetic 01/01/2015    Kim Smith, PT, MPT 11/12/2018, 12:45 PM  [facility] [address]  [place], [place], [address] Phone: [phone]   Fax:  [phone]  Name: CRESCENT GOTHAM MRN: [id number] Date of Birth: 05/10/1995   PHYSICAL THERAPY DISCHARGE SUMMARY  Visits from Start of Care: 1  Current functional level related to goals / functional outcomes:   Refer to above clinical impression for status as of eval visit on 11/12/2018. Patient cancelled or no showed for all scheduled follow up treatment visits and has not returned to PT in >30 days, therefore will proceed with discharge from PT for this episode.   Remaining deficits:   As above.   Education / Equipment:   Initial HEP  Plan: Patient agrees to discharge.  Patient goals were not met. Patient is being discharged due to not returning since the last visit.  ?????     Kim Smith, PT, MPT 12/20/18, 3:46 PM  [facility] [address]  [place], [place], [address] Phone: [phone]   Fax:  [phone]

## 2018-11-14 ENCOUNTER — Ambulatory Visit: Payer: No Typology Code available for payment source

## 2018-11-20 ENCOUNTER — Ambulatory Visit: Payer: No Typology Code available for payment source

## 2018-11-23 ENCOUNTER — Ambulatory Visit: Payer: No Typology Code available for payment source | Attending: Internal Medicine | Admitting: Physical Therapy

## 2019-01-15 MED FILL — ACETAMINOPHEN/COD #3 TABLET: 300-30 | 3 days supply | Qty: 15 | Fill #0

## 2019-07-20 ENCOUNTER — Encounter: Payer: Self-pay | Admitting: Internal Medicine

## 2019-07-23 ENCOUNTER — Other Ambulatory Visit: Payer: Self-pay | Admitting: Internal Medicine

## 2019-07-23 DIAGNOSIS — M62838 Other muscle spasm: Secondary | ICD-10-CM

## 2019-07-23 MED ORDER — CYCLOBENZAPRINE HCL 5 MG PO TABS: 5.0000 mg | ORAL_TABLET | Freq: Three times a day (TID) | ORAL | 1 refills | Status: DC | PRN

## 2019-07-23 MED FILL — CYCLOBENZAPRINE HCL 5 MG TA: 5 | 15 days supply | Qty: 45 | Fill #0

## 2019-11-26 ENCOUNTER — Other Ambulatory Visit (HOSPITAL_COMMUNITY): Payer: Self-pay | Admitting: Pediatrics

## 2019-11-26 MED FILL — predniSONE 20 MG TABS: 20 | 6 days supply | Qty: 9 | Fill #0

## 2019-11-26 MED FILL — FLUTICASONE PROP 50 MCG SPR: 50 | 30 days supply | Qty: 16 | Fill #0

## 2019-12-25 ENCOUNTER — Other Ambulatory Visit (HOSPITAL_COMMUNITY): Payer: Self-pay

## 2019-12-26 ENCOUNTER — Other Ambulatory Visit (HOSPITAL_COMMUNITY): Payer: Self-pay

## 2019-12-26 MED FILL — AMOXICILLIN 500 MG CAPSULE: 500 | 7 days supply | Qty: 21 | Fill #0

## 2019-12-26 MED FILL — ACETAMINOPHEN/COD #3 TABLET: 300-30 | 5 days supply | Qty: 15 | Fill #0

## 2020-05-27 ENCOUNTER — Emergency Department (HOSPITAL_BASED_OUTPATIENT_CLINIC_OR_DEPARTMENT_OTHER): Payer: No Typology Code available for payment source

## 2020-05-27 ENCOUNTER — Emergency Department (HOSPITAL_BASED_OUTPATIENT_CLINIC_OR_DEPARTMENT_OTHER)
Admission: EM | Admit: 2020-05-27 | Discharge: 2020-05-27 | Disposition: A | Discharge status: Home / Self Care | Payer: No Typology Code available for payment source | Attending: Emergency Medicine | Admitting: Emergency Medicine

## 2020-05-27 ENCOUNTER — Other Ambulatory Visit (HOSPITAL_BASED_OUTPATIENT_CLINIC_OR_DEPARTMENT_OTHER): Payer: Self-pay

## 2020-05-27 ENCOUNTER — Other Ambulatory Visit: Payer: Self-pay

## 2020-05-27 ENCOUNTER — Encounter (HOSPITAL_BASED_OUTPATIENT_CLINIC_OR_DEPARTMENT_OTHER): Payer: Self-pay | Admitting: Emergency Medicine

## 2020-05-27 DIAGNOSIS — R0789 Other chest pain: Secondary | ICD-10-CM | POA: Diagnosis present

## 2020-05-27 DIAGNOSIS — R091 Pleurisy: Secondary | ICD-10-CM | POA: Diagnosis not present

## 2020-05-27 HISTORY — DX: Depression, unspecified: F32.A

## 2020-05-27 LAB — BASIC METABOLIC PANEL
Anion gap: 8 (ref 5–15)
BUN: 14 mg/dL (ref 6–20)
CO2: 22 mmol/L (ref 22–32)
Calcium: 9 mg/dL (ref 8.9–10.3)
Chloride: 107 mmol/L (ref 98–111)
Creatinine, Ser: 0.65 mg/dL (ref 0.44–1.00)
GFR, Estimated: >60 mL/min (ref >60)
Glucose, Bld: 112 mg/dL — ABNORMAL HIGH (ref 70–99)
Potassium: 3.9 mmol/L (ref 3.5–5.1)
Sodium: 137 mmol/L (ref 135–145)

## 2020-05-27 LAB — CBC
HCT: 46.9 % — ABNORMAL HIGH (ref 36.0–46.0)
Hemoglobin: 15.9 g/dL — ABNORMAL HIGH (ref 12.0–15.0)
MCH: 31.5 pg (ref 26.0–34.0)
MCHC: 33.9 g/dL (ref 30.0–36.0)
MCV: 93.1 fL (ref 80.0–100.0)
Platelets: 279 10*3/uL (ref 150–400)
RBC: 5.04 MIL/uL (ref 3.87–5.11)
RDW: 12.3 % (ref 11.5–15.5)
WBC: 8 10*3/uL (ref 4.0–10.5)
nRBC: 0 % (ref 0.0–0.2)

## 2020-05-27 LAB — PREGNANCY, URINE: Preg Test, Ur: NEGATIVE

## 2020-05-27 LAB — CBG MONITORING, ED: Glucose-Capillary: 114 mg/dL — ABNORMAL HIGH (ref 70–99)

## 2020-05-27 LAB — D-DIMER, QUANTITATIVE: D-Dimer, Quant: 0.44 ug/mL-FEU (ref 0.00–0.50)

## 2020-05-27 LAB — TROPONIN I (HIGH SENSITIVITY): Troponin I (High Sensitivity): <2 ng/L (ref <18)

## 2020-05-27 MED ORDER — IBUPROFEN 400 MG PO TABS
600.0000 mg | ORAL_TABLET | Freq: Once | ORAL | Status: AC
  Administered 2020-05-27: 600 mg via ORAL
  Filled 2020-05-27: qty 1

## 2020-05-27 MED ORDER — ASPIRIN 81 MG PO CHEW
324.0000 mg | CHEWABLE_TABLET | Freq: Once | ORAL | Status: AC
  Administered 2020-05-27: 324 mg via ORAL
  Filled 2020-05-27: qty 4

## 2020-05-27 MED ORDER — IBUPROFEN 600 MG PO TABS
600.0000 mg | ORAL_TABLET | Freq: Four times a day (QID) | ORAL | 0 refills | Status: DC | PRN
  Filled 2020-05-27: qty 21, 6d supply, fill #0

## 2020-05-27 NOTE — ED Triage Notes (Signed)
Left upper cp since yesterday now gone thru to her back hurts to take a deep breath  Denies heart issues no nausea , vomiting

## 2020-05-27 NOTE — ED Provider Notes (Signed)
[facility] EMERGENCY DEPARTMENT Provider Note   CSN: [id number] Arrival date & time: 05/27/20  0720     History Chief Complaint  Patient presents with  . Chest Pain    Kim Smith is a 25 y.o. female.  HPI  HPI: A 25 year old patient presents for evaluation of chest pain. Initial onset of pain was more than 6 hours ago. The patient's chest pain is described as heaviness/pressure/tightness, is sharp and is not worse with exertion. The patient's chest pain is middle- or left-sided, is not well-localized and does radiate to the arms/jaw/neck. The patient does not complain of nausea and denies diaphoresis. The patient has no history of stroke, has no history of peripheral artery disease, has not smoked in the past 90 days, denies any history of treated diabetes, has no relevant family history of coronary artery disease (first degree relative at less than age 40), is not hypertensive, has no history of hypercholesterolemia and does not have an elevated BMI (>=30).    Patient states she started having the chest pain yesterday.  Initially was in the front of her chest but now has been going to her back.  It hurts when she takes a deep breath.  It also does make her feel short of breath.  She is not having any issues with nausea or vomiting.  No fevers or chills.  No abdominal pain.  No relation to food.  Past Medical History:  Diagnosis Date  . Anxiety   . Depression     Patient Active Problem List   Diagnosis Date Noted  . Neck pain, chronic 10/18/2018  . Muscle spasms of neck 10/18/2018  . BV (bacterial vaginosis) 04/10/2018  . Routine general medical examination at a health care facility 03/06/2018  . Depression with anxiety 09/26/2016  . Perennial and seasonal allergic rhinitis 01/20/2015  . Allergy history, anesthetic 01/01/2015    Past Surgical History:  Procedure Laterality Date  . NO PAST SURGERIES  01/20/15     OB History   No obstetric history on file.      Family History  Problem Relation Age of Onset  . Breast cancer Mother   . Diabetes Maternal Grandmother   . Diabetes Paternal Grandmother   . Cancer Neg Hx   . Early death Neg Hx   . Stroke Neg Hx     Social History   Tobacco Use  . Smoking status: Never Smoker  . Smokeless tobacco: Never Used  Vaping Use  . Vaping Use: Never used  Substance Use Topics  . Alcohol use: Yes    Alcohol/week: 1.0 standard drink    Types: 1 Standard drinks or equivalent per week  . Drug use: No    Home Medications Prior to Admission medications   Medication Sig Start Date End Date Taking? Authorizing Provider  ibuprofen (ADVIL) 600 MG tablet Take 1 tablet (600 mg total) by mouth every 6 (six) hours as needed. 05/27/20  Yes Linwood Dibbles, MD  cyclobenzaprine (FLEXERIL) 5 MG tablet Take 1 tablet (5 mg total) by mouth 3 (three) times daily as needed for muscle spasms. 07/23/19   Etta Grandchild, MD  hydrOXYzine (ATARAX/VISTARIL) 10 MG tablet Take 1 tablet (10 mg total) by mouth 3 (three) times daily as needed. 03/06/18   Etta Grandchild, MD  levonorgestrel (MIRENA) 20 MCG/24HR IUD 1 each by Intrauterine route once.    [provider]    Allergies    Celexa [citalopram hydrobromide] and Eggs or egg-derived products  Review of Systems   Review of Systems  All other systems reviewed and are negative.   Physical Exam Updated Vital Signs BP 110/80 (BP Location: Right Arm)   Pulse 65   Temp 97.9 F (36.6 C) (Oral)   Resp 14   Ht 1.702 m (5\' 7" )   Wt 68 kg   SpO2 100%   BMI 23.49 kg/m   Physical Exam Vitals and nursing note reviewed.  Constitutional:      General: She is not in acute distress.    Appearance: She is well-developed.  HENT:     Head: Normocephalic and atraumatic.     Right Ear: External ear normal.     Left Ear: External ear normal.  Eyes:     General: No scleral icterus.       Right eye: No discharge.        Left eye: No discharge.     Conjunctiva/sclera:  Conjunctivae normal.  Neck:     Trachea: No tracheal deviation.  Cardiovascular:     Rate and Rhythm: Normal rate and regular rhythm.  Pulmonary:     Effort: Pulmonary effort is normal. No respiratory distress.     Breath sounds: Normal breath sounds. No stridor. No wheezing or rales.  Chest:     Comments: Tenderness palpation posterior chest wall Abdominal:     General: Bowel sounds are normal. There is no distension.     Palpations: Abdomen is soft.     Tenderness: There is no abdominal tenderness. There is no guarding or rebound.  Musculoskeletal:        General: No tenderness.     Cervical back: Neck supple.  Skin:    General: Skin is warm and dry.     Findings: No rash.  Neurological:     Mental Status: She is alert.     Cranial Nerves: No cranial nerve deficit (no facial droop, extraocular movements intact, no slurred speech).     Sensory: No sensory deficit.     Motor: No abnormal muscle tone or seizure activity.     Coordination: Coordination normal.     ED Results / Procedures / Treatments   Labs (all labs ordered are listed, but only abnormal results are displayed) Labs Reviewed  BASIC METABOLIC PANEL - Abnormal; Notable for the following components:      Result Value   Glucose, Bld 112 (*)    All other components within normal limits  CBC - Abnormal; Notable for the following components:   Hemoglobin 15.9 (*)    HCT 46.9 (*)    All other components within normal limits  CBG MONITORING, ED - Abnormal; Notable for the following components:   Glucose-Capillary 114 (*)    All other components within normal limits  PREGNANCY, URINE  D-DIMER, QUANTITATIVE  TROPONIN I (HIGH SENSITIVITY)    EKG EKG Interpretation  Date/Time:  Wednesday May 27 2020 07:32:47 EDT Ventricular Rate:  79 PR Interval:  143 QRS Duration: 80 QT Interval:  384 QTC Calculation: 441 R Axis:   61 Text Interpretation: Sinus rhythm No significant change since last tracing Confirmed by  04-28-2002 [id number]) on 05/27/2020 7:49:28 AM   Radiology DG Chest Portable 1 View  Result Date: 05/27/2020 CLINICAL DATA:  Chest pain. EXAM: PORTABLE CHEST 1 VIEW COMPARISON:  Rib series 06/09/2016.  Chest x-ray 05/10/2012. FINDINGS: Mediastinum hilar structures normal. Heart size normal. Low lung volumes. No focal infiltrate. No pleural effusion or pneumothorax. No acute bony abnormality. IMPRESSION: Low lung  volumes.  No acute cardiopulmonary disease. Electronically Signed   By: Maisie Fus  Register   On: 05/27/2020 08:17    Procedures Procedures   Medications Ordered in ED Medications  aspirin chewable tablet 324 mg (324 mg Oral Given 05/27/20 0831)  ibuprofen (ADVIL) tablet 600 mg (600 mg Oral Given 05/27/20 4008)    ED Course  I have reviewed the triage vital signs and the nursing notes.  Pertinent labs & imaging results that were available during my care of the patient were reviewed by me and considered in my medical decision making (see chart for details).    MDM Rules/Calculators/A&P HEAR Score: 1                        Low risk for ACS.  Ed workup reassuring.  Doubt cardiac etiology.  Low risk for PE.  D  Dimer negative.  Doubt pe.  No PNA, PTX.  Suspect pleurisy.  Dc home, nsaids. Final Clinical Impression(s) / ED Diagnoses Final diagnoses:  Pleurisy    Rx / DC Orders ED Discharge Orders         Ordered    ibuprofen (ADVIL) 600 MG tablet  Every 6 hours PRN        05/27/20 0926           Linwood Dibbles, MD 05/28/20 1538

## 2020-05-27 NOTE — Discharge Instructions (Addendum)
Take the medications as prescribed to help with the pain.  Follow-up with your doctor next week to be rechecked if the symptoms persist

## 2020-06-04 ENCOUNTER — Other Ambulatory Visit (HOSPITAL_BASED_OUTPATIENT_CLINIC_OR_DEPARTMENT_OTHER): Payer: Self-pay

## 2020-07-28 ENCOUNTER — Ambulatory Visit: Payer: No Typology Code available for payment source | Admitting: Family

## 2020-08-13 ENCOUNTER — Encounter: Payer: Self-pay | Admitting: Internal Medicine

## 2020-08-13 ENCOUNTER — Other Ambulatory Visit (HOSPITAL_COMMUNITY): Payer: Self-pay

## 2020-08-13 ENCOUNTER — Other Ambulatory Visit: Payer: Self-pay

## 2020-08-13 ENCOUNTER — Ambulatory Visit (INDEPENDENT_AMBULATORY_CARE_PROVIDER_SITE_OTHER): Payer: No Typology Code available for payment source | Admitting: Internal Medicine

## 2020-08-13 VITALS — BP 110/76 | HR 86 | Temp 98.1°F | Ht 67.0 in | Wt 179.0 lb

## 2020-08-13 DIAGNOSIS — R1032 Left lower quadrant pain: Secondary | ICD-10-CM

## 2020-08-13 DIAGNOSIS — Z01419 Encounter for gynecological examination (general) (routine) without abnormal findings: Secondary | ICD-10-CM

## 2020-08-13 DIAGNOSIS — M542 Cervicalgia: Secondary | ICD-10-CM | POA: Diagnosis not present

## 2020-08-13 DIAGNOSIS — R739 Hyperglycemia, unspecified: Secondary | ICD-10-CM | POA: Diagnosis not present

## 2020-08-13 DIAGNOSIS — G8929 Other chronic pain: Secondary | ICD-10-CM

## 2020-08-13 MED ORDER — CYCLOBENZAPRINE HCL 10 MG PO TABS
10.0000 mg | ORAL_TABLET | Freq: Three times a day (TID) | ORAL | 2 refills | Status: DC | PRN
  Filled 2020-08-13: qty 40, 14d supply, fill #0

## 2020-08-13 NOTE — Progress Notes (Signed)
Patient ID: Kim Smith, female   DOB: Jan 14, 1996, 25 y.o.   MRN: [id number]        Chief Complaint: follow up left sided abd pain, chronic neck pain,and due for pap       HPI:  Kim Smith is a 25 y.o. female here with c/o mon am crampy LLQ pain as she has sometimes with constipation but this time not better with BM, lassted over 3 days, spent much of that time in bed, just yesterday started yesterday better and today overal much better in that she I able to get up and walk about;  constant.  No n/v, fever chills , but pain seemed a bit worse with eating and felt satiety stopped eating so the pain would get better. No radation. No hx of frq UTIs and Denies urinary symptoms such as dysuria, frequency, urgency, flank pain, hematuria or n/v, fever, chills. Actaully feels almost back to normal today excetp some pressure in the area to palpation.  S/p IUD and has very low suspicion for pregnancy, and home test neg about 1 mo ago.  Same partner for 2 yrs, seems low chance of STD per pt.  Has had ongoing constipation in the past with last miralax use was maybe 10 yrs ago, so wasn't thinking of that.  Denies worsening reflux, dysphagia, or blood.  Also due for pap, does not currently have a GYN provider.  Also has chornic persistently recurring post neck pain worse on the left than right for several months with radicular symptoms, but asking for increased flexeril to 10 mg as taking 2 of the 5 mg seemed to help the stiffness better in the past few weeks and now out.  Pt denies chest pain, increased sob or doe, wheezing, orthopnea, PND, increased LE swelling, palpitations, dizziness or syncope.   Pt denies polydipsia, polyuria.  Denies worsening depressive symptoms, suicidal ideation, or panic      Wt Readings from Last 3 Encounters:  08/13/20 179 lb (81.2 kg)  05/27/20 150 lb (68 kg)  10/17/18 172 lb 12 oz (78.4 kg)   BP Readings from Last 3 Encounters:  08/13/20 110/76  05/27/20 110/80  10/17/18  112/78         Past Medical History:  Diagnosis Date   Anxiety    Depression    Past Surgical History:  Procedure Laterality Date   NO PAST SURGERIES  01/20/15    reports that she has never smoked. She has never used smokeless tobacco. She reports current alcohol use of about 1.0 standard drink of alcohol per week. She reports that she does not use drugs. family history includes Breast cancer in her mother; Diabetes in her maternal grandmother and paternal grandmother. Allergies  Allergen Reactions   Celexa [Citalopram Hydrobromide]     "ZOMBIE"   Eggs Or Egg-Derived Products Nausea And Vomiting   Current Outpatient Medications on File Prior to Visit  Medication Sig Dispense Refill   fluticasone (FLONASE) 50 MCG/ACT nasal spray PLACE 2 SPRAYS INTO EACH NOSTRIL IN THE MORNING. 16 g 3   hydrOXYzine (ATARAX/VISTARIL) 10 MG tablet Take 1 tablet (10 mg total) by mouth 3 (three) times daily as needed. 90 tablet 2   ibuprofen (ADVIL) 600 MG tablet Take 1 tablet (600 mg total) by mouth every 6 (six) hours as needed. 21 tablet 0   levonorgestrel (MIRENA) 20 MCG/24HR IUD 1 each by Intrauterine route once.     No current facility-administered medications on file prior to visit.  ROS:  All others reviewed and negative.  Objective        PE:  BP 110/76 (BP Location: Left Arm, Patient Position: Sitting, Cuff Size: Normal)   Pulse 86   Temp 98.1 F (36.7 C) (Oral)   Ht 5\' 7"  (1.702 m)   Wt 179 lb (81.2 kg)   SpO2 97%   BMI 28.04 kg/m                 Constitutional: Pt appears in NAD               HENT: Head: NCAT.                Right Ear: External ear normal.                 Left Ear: External ear normal.                Eyes: . Pupils are equal, round, and reactive to light. Conjunctivae and EOM are normal               Nose: without d/c or deformity               Neck: Neck supple. Gross normal ROM               Cardiovascular: Normal rate and regular rhythm.                  Pulmonary/Chest: Effort normal and breath sounds without rales or wheezing.                Abd:  Soft, NT, ND, + BS, no organomegaly               Neurological: Pt is alert. At baseline orientation, motor grossly intact               Skin: Skin is warm. No rashes, no other new lesions, LE edema - none               Psychiatric: Pt behavior is normal without agitation   Micro: none  Cardiac tracings I have personally interpreted today:  none  Pertinent Radiological findings (summarize): none   Lab Results  Component Value Date   WBC 8.0 05/27/2020   HGB 15.9 (H) 05/27/2020   HCT 46.9 (H) 05/27/2020   PLT 279 05/27/2020   GLUCOSE 112 (H) 05/27/2020   CHOL 167 11/05/2012   TRIG 87 11/05/2012   HDL 55 11/05/2012   LDLCALC 95 11/05/2012   ALT 20 09/26/2016   AST 22 09/26/2016   NA 137 05/27/2020   K 3.9 05/27/2020   CL 107 05/27/2020   CREATININE 0.65 05/27/2020   BUN 14 05/27/2020   CO2 22 05/27/2020   TSH 0.999 09/26/2016   Assessment/Plan:  Kim Smith is a 25 y.o. Other or two or more races [6] female with  has a past medical history of Anxiety and Depression.  Hyperglycemia  Stable, pt to continue current medical treatment  - diet, and for a1c with next labs   LLQ pain By hx and exam most likely constipation possibly IBS related; currently pt non toxic, exam benign, ok to hold further last testing or GI referral for now, to consider colace and/or miralax for recurrent symptoms,  to f/u any worsening symptoms or concerns  Neck pain, chronic Ok for increased flexeril to 10 tid prn,  to f/u any worsening symptoms or concerns  Well woman exam with routine  gynecological exam Pt due for pap - asks for GYN referral  Followup: Return if symptoms worsen or fail to improve.  Oliver Barre, MD 08/16/2020 3:15 PM [facility]

## 2020-08-13 NOTE — Patient Instructions (Signed)
Ok to increase the muscle relaxer to 10 mg as needed  Please take OTC colace and/or miralax as needed for constipation  Please continue all other medications as before, and refills have been done if requested.  Please have the pharmacy call with any other refills you may need.  Please continue your efforts at being more active, low cholesterol diet, and weight control.  Please keep your appointments with your specialists as you may have planned  You will be contacted regarding the referral for: GYN

## 2020-08-16 ENCOUNTER — Encounter: Payer: Self-pay | Admitting: Internal Medicine

## 2020-08-16 NOTE — Assessment & Plan Note (Signed)
By hx and exam most likely constipation possibly IBS related; currently pt non toxic, exam benign, ok to hold further last testing or GI referral for now, to consider colace and/or miralax for recurrent symptoms,  to f/u any worsening symptoms or concerns

## 2020-08-16 NOTE — Assessment & Plan Note (Signed)
Ok for increased flexeril to 10 tid prn,  to f/u any worsening symptoms or concerns

## 2020-08-16 NOTE — Assessment & Plan Note (Signed)
  Stable, pt to continue current medical treatment  - diet, and for a1c with next labs

## 2020-08-16 NOTE — Assessment & Plan Note (Signed)
Pt due for pap - asks for GYN referral

## 2020-08-26 ENCOUNTER — Encounter: Payer: No Typology Code available for payment source | Admitting: Family Medicine

## 2021-02-01 IMAGING — DX CERVICAL SPINE - COMPLETE 4+ VIEW
6 series · 6 of 6 positions shown · non-contrast
Comparison: None.

CLINICAL DATA: Chronic posterior neck pain, worsened recently. No
known injury.

EXAM:
CERVICAL SPINE - COMPLETE 4+ VIEW

[c-spine lat]
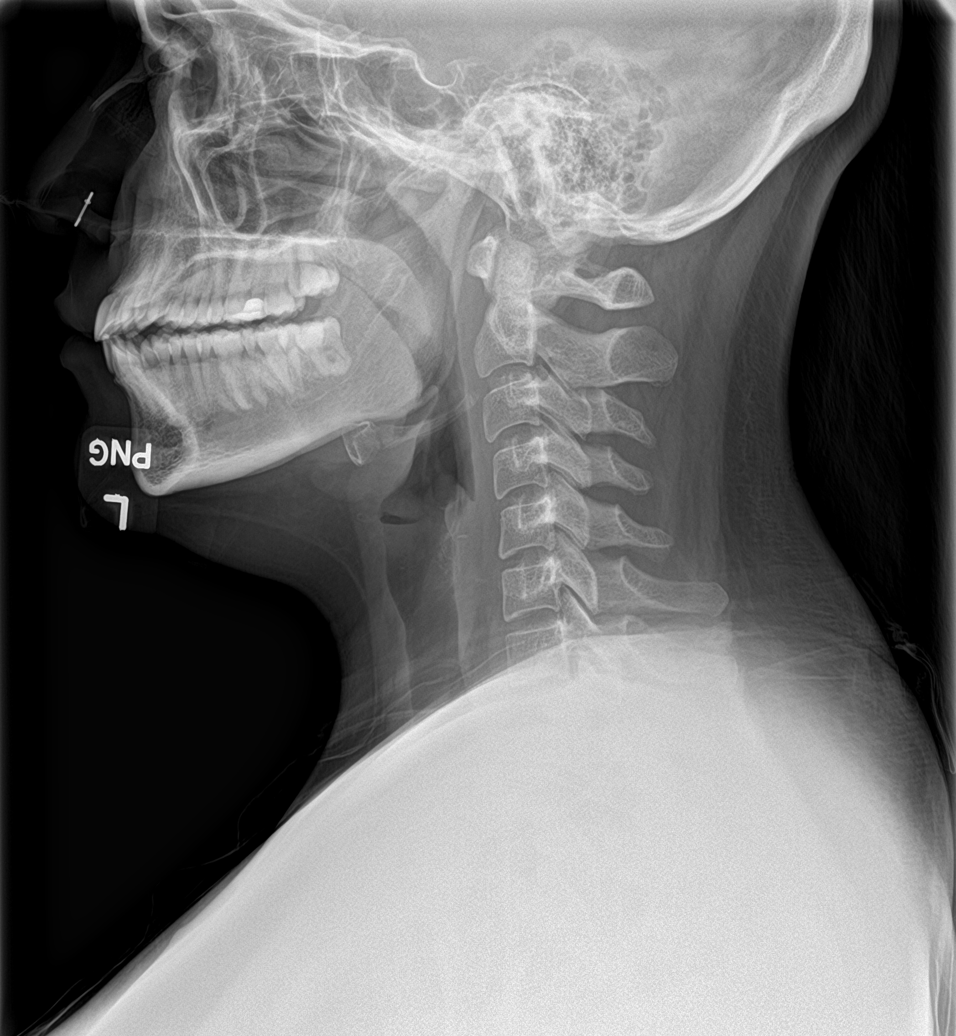

[c-spine obl (1 of 2)]
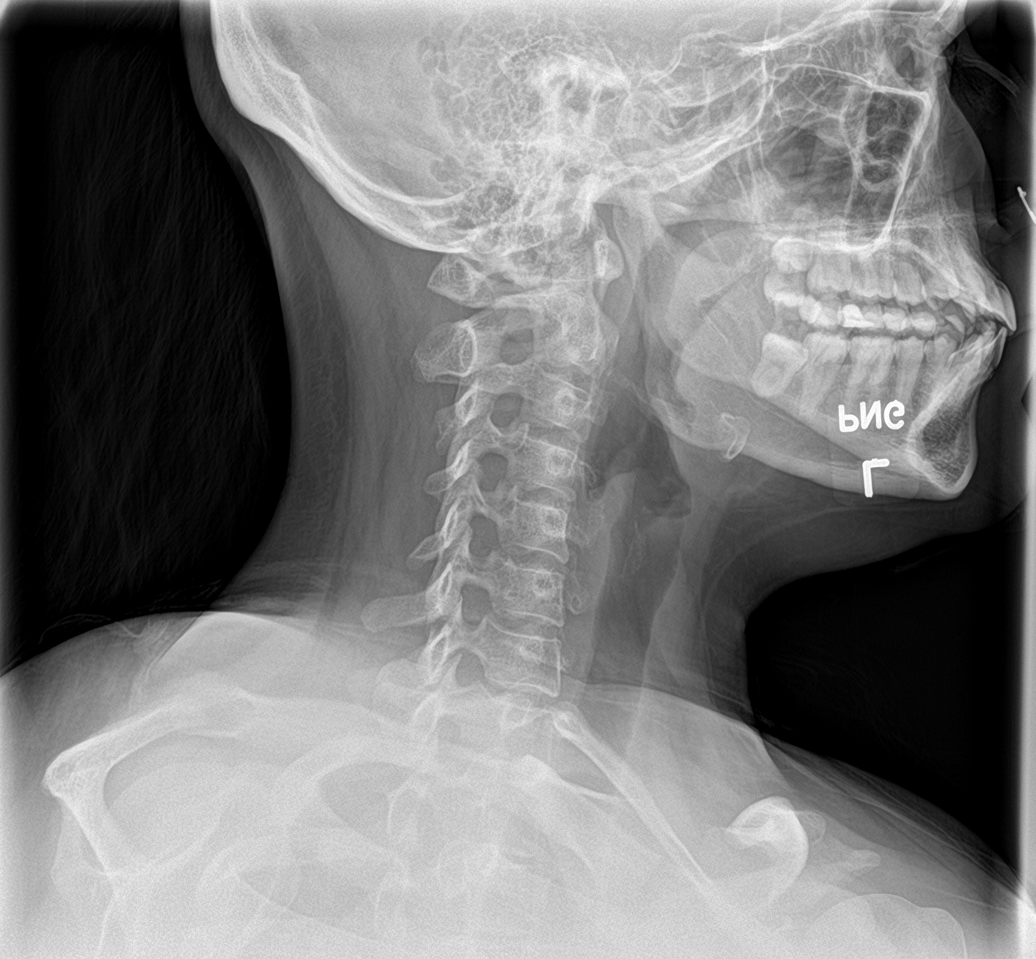

[c-spine obl (2 of 2)]
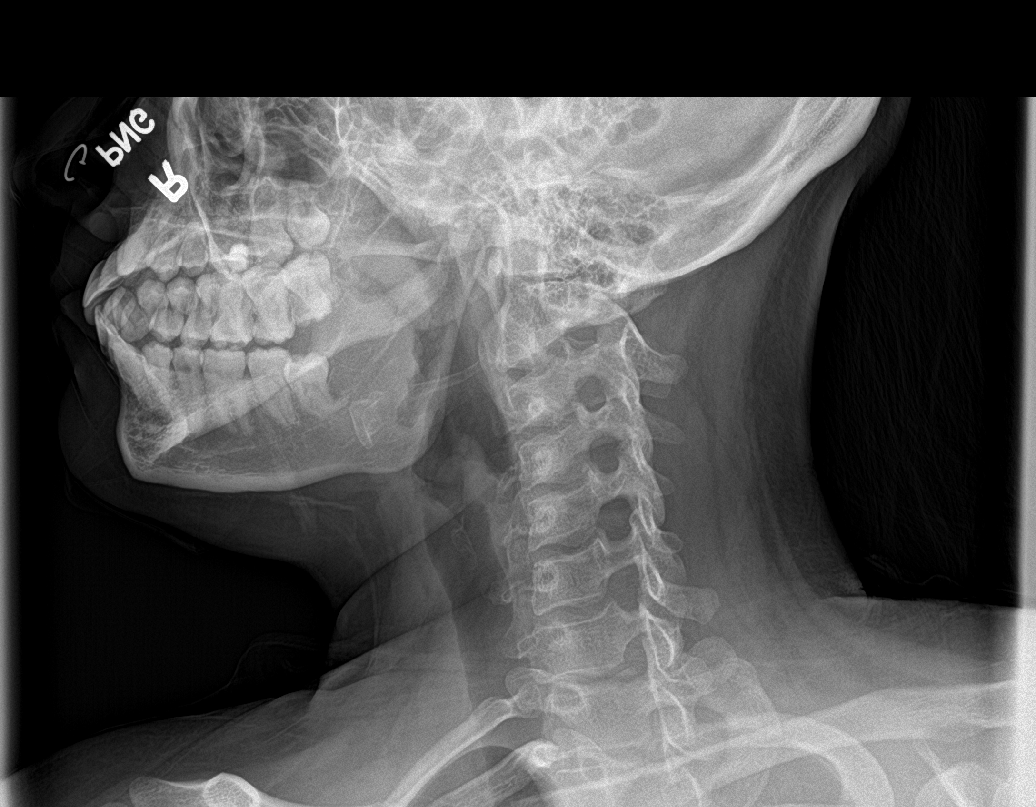

[c-spine ap]
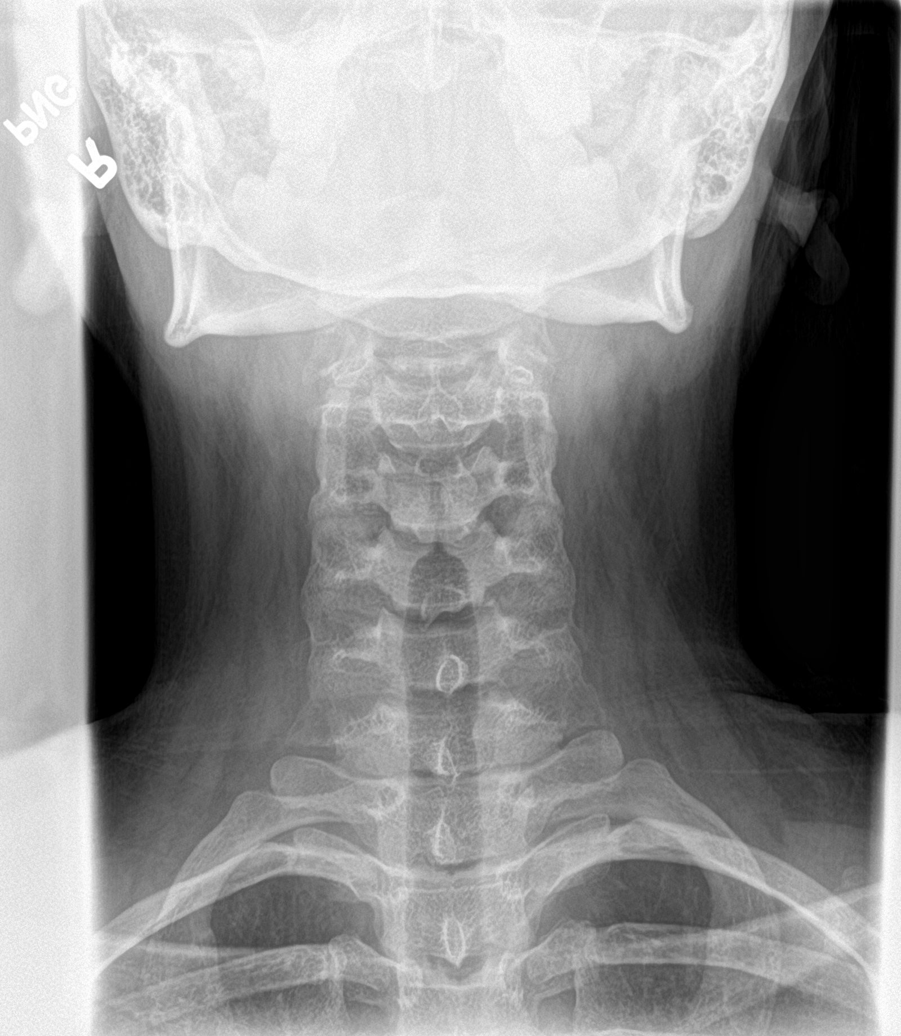

[c-spine open mouth]
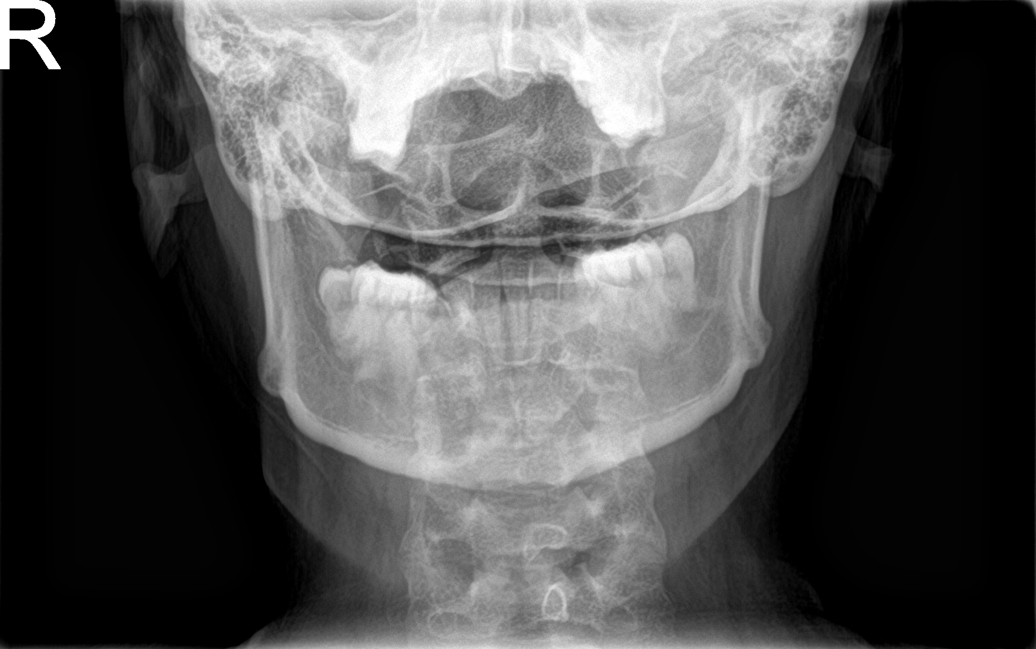

[swimmer]
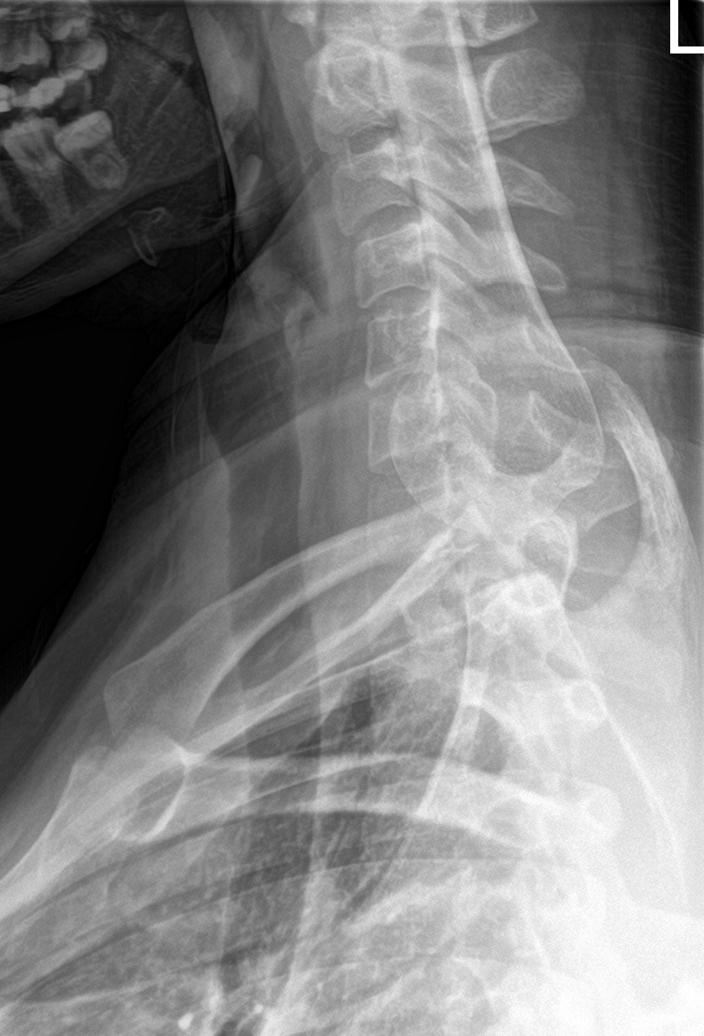

[6 of 6 positions shown; findings below may reference images not displayed]

FINDINGS: C1 to the superior endplate of C7 is imaged on the provided lateral
radiograph, however there is adequate visualization of the
cervicothoracic junction on the provided swimmer's radiograph.

Straightening and slight reversal of the expected cervical lordosis
with mild kyphosis centered about the C4-C5 articulation. The dens
appears normally positioned between the lateral masses of C1. The
bilateral facets appear normally aligned.

Cervical vertebral body heights are preserved. Prevertebral soft
tissues are normal.

Cervical intervertebral disc space heights are preserved.

The bilateral neural foramina appear widely patent given obliquity.

Regional soft tissues appear normal.

Limited visualization of the lung apices is normal.
IMPRESSION: Straightening and slight reversal of the expected cervical lordosis,
nonspecific though could be seen in the setting of muscle spasm.
Otherwise, no explanation for patient's acute on chronic neck pain.

## 2022-01-06 ENCOUNTER — Other Ambulatory Visit (HOSPITAL_BASED_OUTPATIENT_CLINIC_OR_DEPARTMENT_OTHER): Payer: Self-pay

## 2022-01-06 ENCOUNTER — Ambulatory Visit
Admission: EM | Admit: 2022-01-06 | Discharge: 2022-01-06 | Disposition: A | Discharge status: Home / Self Care | Payer: No Typology Code available for payment source | Attending: Family Medicine | Admitting: Family Medicine

## 2022-01-06 DIAGNOSIS — S6710XA Crushing injury of unspecified finger(s), initial encounter: Secondary | ICD-10-CM | POA: Diagnosis not present

## 2022-01-06 DIAGNOSIS — S61309A Unspecified open wound of unspecified finger with damage to nail, initial encounter: Secondary | ICD-10-CM | POA: Diagnosis not present

## 2022-01-06 MED ORDER — IBUPROFEN 800 MG PO TABS
800.0000 mg | ORAL_TABLET | Freq: Three times a day (TID) | ORAL | 0 refills | Status: AC
  Filled 2022-01-06: qty 21, 7d supply, fill #0

## 2022-01-06 NOTE — ED Provider Notes (Signed)
[name]    CSN: [id number] Arrival date & time: 01/06/22  1218      History   Chief Complaint Chief Complaint  Patient presents with   Finger Injury    Right middle finger injury x1 day    HPI Kim Smith is a 26 y.o. female.   HPI  Patient has a partially avulsed one of her fingernail in the right middle finger today.  It got caught in the door.  The nail broke off halfway down the nailbed.  Bleeding controlled with pressure.  Past Medical History:  Diagnosis Date   Anxiety    Depression     Patient Active Problem List   Diagnosis Date Noted   Well woman exam with routine gynecological exam 08/13/2020   LLQ pain 08/13/2020   Hyperglycemia 08/13/2020   Neck pain, chronic 10/18/2018   Muscle spasms of neck 10/18/2018   BV (bacterial vaginosis) 04/10/2018   Routine general medical examination at a health care facility 03/06/2018   Depression with anxiety 09/26/2016   Perennial and seasonal allergic rhinitis 01/20/2015   Allergy history, anesthetic 01/01/2015    Past Surgical History:  Procedure Laterality Date   NO PAST SURGERIES  01/20/15    OB History   No obstetric history on file.      Home Medications    Prior to Admission medications   Medication Sig Start Date End Date Taking? Authorizing Provider  cyclobenzaprine (FLEXERIL) 10 MG tablet Take 1 tablet (10 mg total) by mouth 3 (three) times daily as needed for muscle spasms. 08/13/20  Yes Corwin Levins, MD  ibuprofen (ADVIL) 800 MG tablet Take 1 tablet (800 mg total) by mouth 3 (three) times daily. 01/06/22  Yes Eustace Moore, MD  levonorgestrel [facility]) 20 MCG/24HR IUD 1 each by Intrauterine route once.   Yes [provider]  fluticasone (FLONASE) 50 MCG/ACT nasal spray PLACE 2 SPRAYS INTO EACH NOSTRIL IN THE MORNING. 11/26/19 11/25/20  Leinbach, Alita Chyle, MD    Family History Family History  Problem Relation Age of Onset   Breast cancer Mother     Diabetes Maternal Grandmother    Diabetes Paternal Grandmother    Cancer Neg Hx    Early death Neg Hx    Stroke Neg Hx     Social History Social History   Tobacco Use   Smoking status: Never   Smokeless tobacco: Never  Vaping Use   Vaping Use: Never used  Substance Use Topics   Alcohol use: Yes    Alcohol/week: 1.0 standard drink of alcohol    Types: 1 Standard drinks or equivalent per week   Drug use: No     Allergies   Celexa [citalopram hydrobromide]   Review of Systems Review of Systems See HPI  Physical Exam Triage Vital Signs ED Triage Vitals  Enc Vitals Group     BP 01/06/22 1234 115/75     Pulse Rate 01/06/22 1234 77     Resp 01/06/22 1234 20     Temp 01/06/22 1234 97.8 F (36.6 C)     Temp Source 01/06/22 1234 Oral     SpO2 01/06/22 1234 99 %     Weight 01/06/22 1231 175 lb (79.4 kg)     Height 01/06/22 1231 5\' 8"  (1.727 m)     Head Circumference --      Peak Flow --      Pain Score 01/06/22 1231 5     Pain Loc --  Pain Edu? --      Excl. in GC? --    No data found.  Updated Vital Signs BP 115/75 (BP Location: Left Arm)   Pulse 77   Temp 97.8 F (36.6 C) (Oral)   Resp 20   Ht 5\' 8"  (1.727 m)   Wt 79.4 kg   LMP 01/02/2022   SpO2 99%   BMI 26.61 kg/m       Physical Exam Constitutional:      General: She is not in acute distress.    Appearance: She is well-developed.  HENT:     Head: Normocephalic and atraumatic.  Eyes:     Conjunctiva/sclera: Conjunctivae normal.     Pupils: Pupils are equal, round, and reactive to light.  Cardiovascular:     Rate and Rhythm: Normal rate.  Pulmonary:     Effort: Pulmonary effort is normal. No respiratory distress.  Abdominal:     General: There is no distension.     Palpations: Abdomen is soft.  Musculoskeletal:        General: Normal range of motion.     Cervical back: Normal range of motion.  Skin:    General: Skin is warm and dry.  Neurological:     Mental Status: She is alert.   Psychiatric:        Mood and Affect: Mood normal.        Behavior: Behavior normal.      UC Treatments / Results  Labs (all labs ordered are listed, but only abnormal results are displayed) Labs Reviewed - No data to display  EKG   Radiology No results found.  Procedures Procedures (including critical care time)  Medications Ordered in UC Medications - No data to display  Initial Impression / Assessment and Plan / UC Course  I have reviewed the triage vital signs and the nursing notes.  Pertinent labs & imaging results that were available during my care of the patient were reviewed by me and considered in my medical decision making (see chart for details).    Wound care discussed Final Clinical Impressions(s) / UC Diagnoses   Final diagnoses:  Crushing injury of finger, initial encounter  Partial avulsion of fingernail, initial encounter     Discharge Instructions      Tetanus up to date Clean daily and apply antibiotic ointment and bandage Keep covered until the skin at the tip is no longer raw Watch for infection Call for problems   ED Prescriptions     Medication Sig Dispense Auth. Provider   ibuprofen (ADVIL) 800 MG tablet Take 1 tablet (800 mg total) by mouth 3 (three) times daily. 21 tablet 13/01/2022, MD      PDMP not reviewed this encounter.   Eustace Moore, MD 01/06/22 1321

## 2022-01-06 NOTE — Discharge Instructions (Signed)
Tetanus up to date Clean daily and apply antibiotic ointment and bandage Keep covered until the skin at the tip is no longer raw Watch for infection Call for problems

## 2022-01-06 NOTE — ED Triage Notes (Signed)
Pt states that she injured her right middle finger. X1 day

## 2022-04-21 ENCOUNTER — Encounter: Payer: Self-pay | Admitting: Family Medicine

## 2022-04-21 ENCOUNTER — Other Ambulatory Visit (HOSPITAL_COMMUNITY)
Admission: RE | Admit: 2022-04-21 | Discharge: 2022-04-21 | Disposition: A | Discharge status: Home / Self Care | Payer: 59 | Source: Ambulatory Visit | Attending: Family Medicine | Admitting: Family Medicine

## 2022-04-21 ENCOUNTER — Ambulatory Visit (INDEPENDENT_AMBULATORY_CARE_PROVIDER_SITE_OTHER): Payer: 59 | Admitting: Family Medicine

## 2022-04-21 VITALS — BP 115/71 | HR 90 | Ht 68.0 in | Wt 172.0 lb

## 2022-04-21 DIAGNOSIS — Z01419 Encounter for gynecological examination (general) (routine) without abnormal findings: Secondary | ICD-10-CM | POA: Diagnosis not present

## 2022-04-21 DIAGNOSIS — T8332XA Displacement of intrauterine contraceptive device, initial encounter: Secondary | ICD-10-CM

## 2022-04-21 DIAGNOSIS — Z113 Encounter for screening for infections with a predominantly sexual mode of transmission: Secondary | ICD-10-CM

## 2022-04-21 NOTE — Progress Notes (Signed)
ANNUAL EXAM Patient name: Kim Smith MRN [id number]  Date of birth: 1995-09-25 Chief Complaint:   Annual Exam  History of Present Illness:   Kim Smith is a 27 y.o.  G0P0000  female  being seen today for a routine annual exam.  Current complaints: IUD placed 5-6 years ago. Starting to get heavier menses with cramping.  Patient's last menstrual period was 03/24/2022 (approximate).    Last pap 2020. Results were:  normal . H/O abnormal pap: no Last mammogram: n/a     08/13/2020    8:34 AM 03/06/2018   11:07 AM 09/26/2016   10:53 AM 05/17/2016    4:06 PM 03/17/2016    9:15 AM  Depression screen PHQ 2/9  Decreased Interest 0 0 1 0 0  Down, Depressed, Hopeless '1 1 1 '$ 0 0  PHQ - 2 Score '1 1 2 '$ 0 0  Altered sleeping  2  0   Tired, decreased energy  1  0   Change in appetite  1  0   Feeling bad or failure about yourself   1  0   Trouble concentrating  0  0   Moving slowly or fidgety/restless  0  0   Suicidal thoughts  0  0   PHQ-9 Score  6  0   Difficult doing work/chores  Somewhat difficult            No data to display           Review of Systems:   Pertinent items are noted in HPI Denies any headaches, blurred vision, fatigue, shortness of breath, chest pain, abdominal pain, abnormal vaginal discharge/itching/odor/irritation, problems with periods, bowel movements, urination, or intercourse unless otherwise stated above. Pertinent History Reviewed:  Reviewed past medical,surgical, social and family history.  Reviewed problem list, medications and allergies. Physical Assessment:   Vitals:   04/21/22 1351  BP: 115/71  Pulse: 90  Weight: 172 lb (78 kg)  Height: '5\' 8"'$  (1.727 m)  Body mass index is 26.15 kg/m.        Physical Examination:   General appearance - well appearing, and in no distress  Mental status - alert, oriented to person, place, and time  Psych:  She has a normal mood and affect  Skin - warm and dry, normal color, no suspicious lesions  noted  Chest - effort normal, all lung fields clear to auscultation bilaterally  Heart - normal rate and regular rhythm  Neck:  midline trachea, no thyromegaly or nodules  Breasts - breasts appear normal, no suspicious masses, no skin or nipple changes or axillary nodes  Abdomen - soft, nontender, nondistended, no masses or organomegaly  Pelvic - VULVA: normal appearing vulva with no masses, tenderness or lesions  VAGINA: normal appearing vagina with normal color and discharge, no lesions  CERVIX: normal appearing cervix without discharge or lesions, no CMT  Thin prep pap is done with HR HPV cotesting  UTERUS: uterus is felt to be normal size, shape, consistency and nontender   ADNEXA: No adnexal masses or tenderness noted.  Extremities:  No swelling or varicosities noted  Chaperone present for exam  Assessment & Plan:  1. Well woman exam with routine gynecological exam - Cytology - PAP( [place])  2. Routine screening for STI (sexually transmitted infection) - Cytology - PAP( [place]) - HIV antibody (with reflex) - RPR - Hepatitis B Surface AntiGEN - Hepatitis C Antibody  3. Intrauterine contraceptive device threads lost, initial encounter IUD  strings not seen. Not able to retrieve them with cytobrush. Will get [place]. - US PELVIC COMPLETE WITH TRANSVAGINAL; Future  Orders Placed This Encounter  Procedures   US PELVIC COMPLETE WITH TRANSVAGINAL   HIV antibody (with reflex)   RPR   Hepatitis B Surface AntiGEN   Hepatitis C Antibody    Meds: No orders of the defined types were placed in this encounter.   Follow-up: No follow-ups on file.  Truett Mainland, DO 04/21/2022 2:27 PM

## 2022-04-22 ENCOUNTER — Other Ambulatory Visit (HOSPITAL_COMMUNITY): Payer: Self-pay | Admitting: Family Medicine

## 2022-04-22 ENCOUNTER — Other Ambulatory Visit: Payer: Self-pay | Admitting: Internal Medicine

## 2022-04-22 LAB — HEPATITIS C ANTIBODY: Hep C Virus Ab: NONREACTIVE

## 2022-04-22 LAB — RPR: RPR Ser Ql: NONREACTIVE

## 2022-04-22 LAB — HEPATITIS B SURFACE ANTIGEN: Hepatitis B Surface Ag: NEGATIVE

## 2022-04-22 LAB — HIV ANTIBODY (ROUTINE TESTING W REFLEX): HIV Screen 4th Generation wRfx: NONREACTIVE

## 2022-04-22 NOTE — Telephone Encounter (Signed)
Ok to pcp please 

## 2022-04-23 ENCOUNTER — Other Ambulatory Visit (HOSPITAL_COMMUNITY): Payer: Self-pay

## 2022-04-25 ENCOUNTER — Ambulatory Visit (HOSPITAL_BASED_OUTPATIENT_CLINIC_OR_DEPARTMENT_OTHER)
Admission: RE | Admit: 2022-04-25 | Discharge: 2022-04-25 | Disposition: A | Discharge status: Home / Self Care | Payer: 59 | Source: Ambulatory Visit | Attending: Family Medicine | Admitting: Family Medicine

## 2022-04-25 DIAGNOSIS — T8332XA Displacement of intrauterine contraceptive device, initial encounter: Secondary | ICD-10-CM | POA: Insufficient documentation

## 2022-04-25 LAB — CYTOLOGY - PAP
Adequacy: ABSENT
Chlamydia: NEGATIVE
Comment: NEGATIVE
Comment: NEGATIVE
Comment: NORMAL
Diagnosis: NEGATIVE
Neisseria Gonorrhea: NEGATIVE
Trichomonas: NEGATIVE

## 2022-04-26 ENCOUNTER — Telehealth: Payer: Self-pay | Admitting: General Practice

## 2022-04-26 ENCOUNTER — Other Ambulatory Visit (HOSPITAL_BASED_OUTPATIENT_CLINIC_OR_DEPARTMENT_OTHER): Payer: Self-pay

## 2022-04-26 ENCOUNTER — Other Ambulatory Visit (HOSPITAL_BASED_OUTPATIENT_CLINIC_OR_DEPARTMENT_OTHER): Payer: Self-pay | Admitting: Family Medicine

## 2022-04-26 MED ORDER — CYCLOBENZAPRINE HCL 10 MG PO TABS
10.0000 mg | ORAL_TABLET | Freq: Three times a day (TID) | ORAL | 0 refills | Status: AC | PRN
  Filled 2022-04-26: qty 40, 14d supply, fill #0

## 2022-04-26 NOTE — Telephone Encounter (Signed)
-----   Message from Truett Mainland, DO sent at 04/26/2022  4:14 PM EST ----- Can get scheduled for IUD removal in office.

## 2022-04-26 NOTE — Addendum Note (Signed)
Addended by: Biagio Borg on: 04/26/2022 09:33 AM   Modules accepted: Orders

## 2022-04-26 NOTE — Telephone Encounter (Signed)
Left message on VM for patient to contact our office to schedule IUD removal with Dr. Nehemiah Settle.

## 2022-04-27 ENCOUNTER — Other Ambulatory Visit (HOSPITAL_BASED_OUTPATIENT_CLINIC_OR_DEPARTMENT_OTHER): Payer: Self-pay

## 2022-05-26 ENCOUNTER — Ambulatory Visit: Payer: Self-pay | Admitting: Family Medicine

## 2022-09-12 IMAGING — DX DG CHEST 1V PORT
1 series · 1 of 1 positions shown · non-contrast
Comparison: Rib series 06/09/2016.  Chest x-ray 05/10/2012.

CLINICAL DATA: Chest pain.

EXAM:
PORTABLE CHEST 1 VIEW

[chest ap]
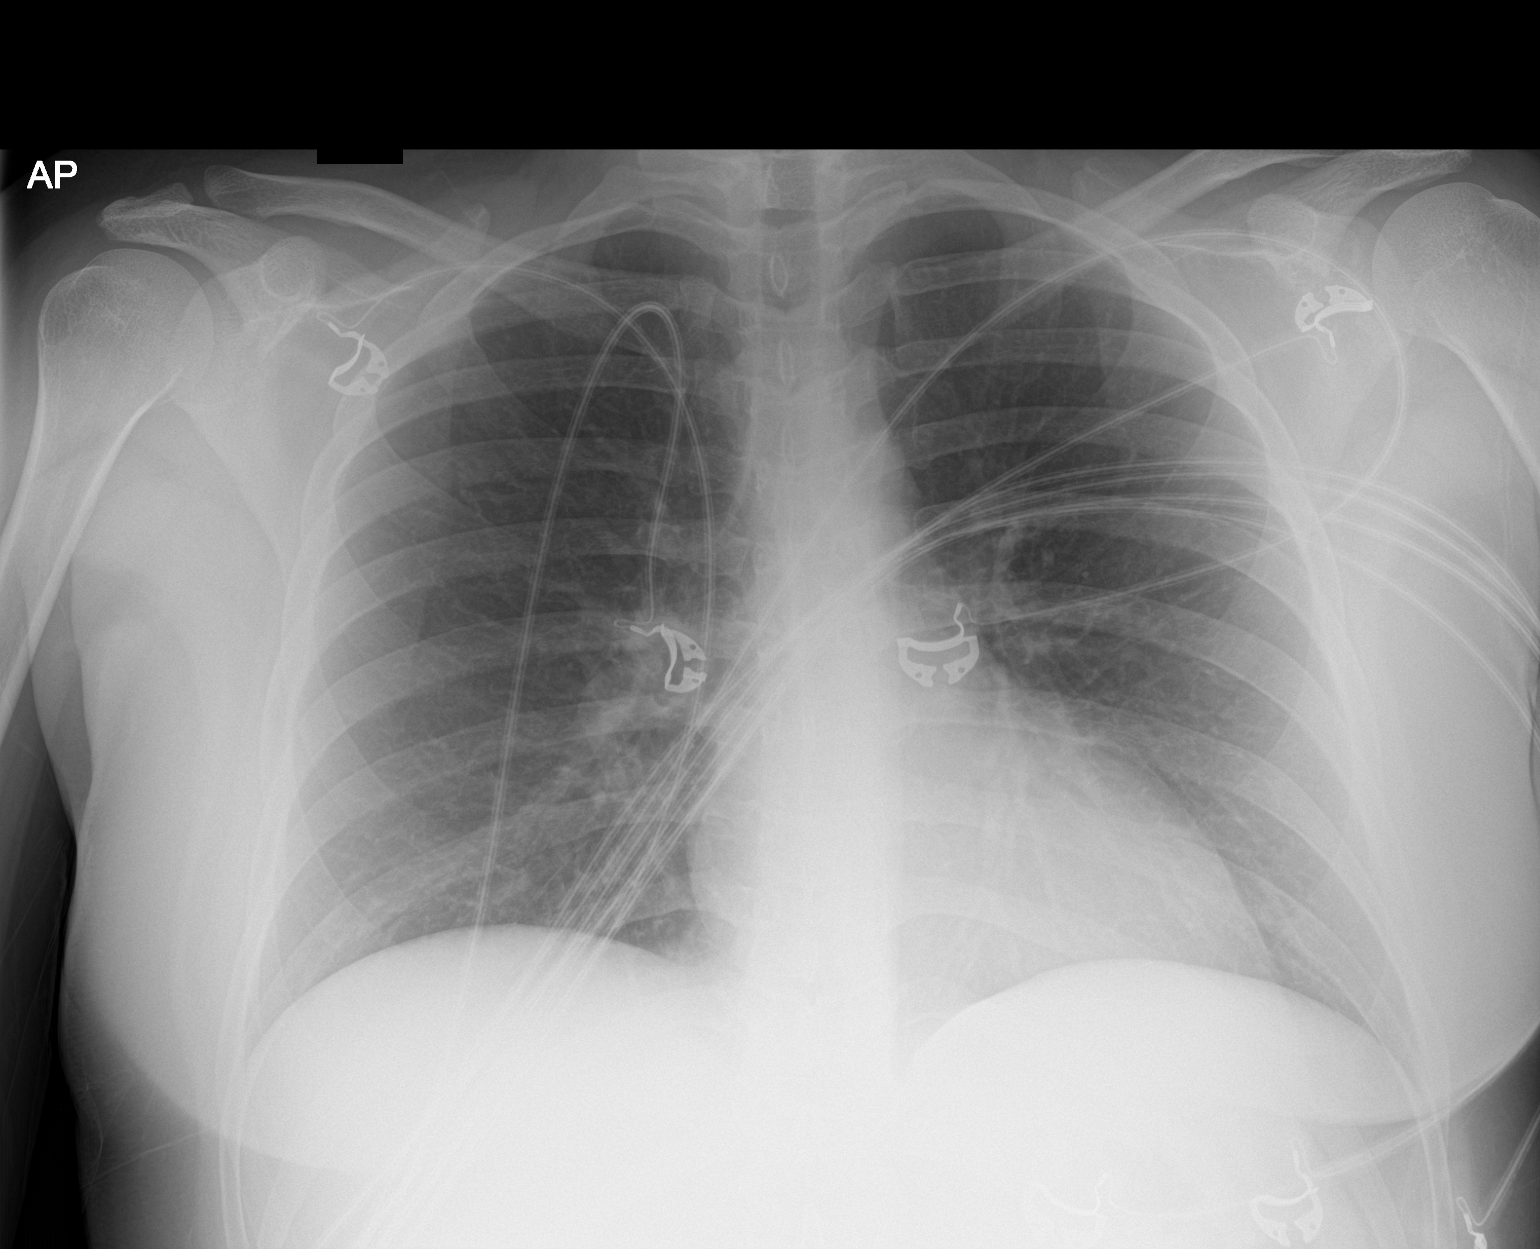

[1 of 1 positions shown; findings below may reference images not displayed]

FINDINGS: Mediastinum hilar structures normal. Heart size normal. Low lung
volumes. No focal infiltrate. No pleural effusion or pneumothorax.
No acute bony abnormality.
IMPRESSION: Low lung volumes.  No acute cardiopulmonary disease.

## 2022-09-29 ENCOUNTER — Other Ambulatory Visit (HOSPITAL_COMMUNITY): Payer: Self-pay

## 2022-09-29 MED ORDER — AMOXICILLIN 500 MG PO CAPS
500.0000 mg | ORAL_CAPSULE | Freq: Three times a day (TID) | ORAL | 0 refills | Status: AC
  Filled 2022-09-29: qty 21, 7d supply, fill #0

## 2023-01-23 ENCOUNTER — Telehealth: Payer: Self-pay | Admitting: Physician Assistant

## 2023-01-23 ENCOUNTER — Other Ambulatory Visit (HOSPITAL_BASED_OUTPATIENT_CLINIC_OR_DEPARTMENT_OTHER): Payer: Self-pay

## 2023-01-23 DIAGNOSIS — A084 Viral intestinal infection, unspecified: Secondary | ICD-10-CM

## 2023-01-23 MED ORDER — LOPERAMIDE HCL 2 MG PO TABS
2.0000 mg | ORAL_TABLET | Freq: Four times a day (QID) | ORAL | 0 refills | Status: AC | PRN
  Filled 2023-01-23: qty 24, 6d supply, fill #0

## 2023-01-23 MED ORDER — ONDANSETRON 4 MG PO TBDP
4.0000 mg | ORAL_TABLET | Freq: Three times a day (TID) | ORAL | 0 refills | Status: AC | PRN
  Filled 2023-01-23: qty 20, 7d supply, fill #0

## 2023-01-23 NOTE — Addendum Note (Signed)
Addended by: Margaretann Loveless on: 01/23/2023 03:20 PM   Modules accepted: Orders

## 2023-01-23 NOTE — Progress Notes (Signed)

## 2023-11-27 ENCOUNTER — Ambulatory Visit: Admitting: Obstetrics and Gynecology

## 2023-12-04 ENCOUNTER — Ambulatory Visit (INDEPENDENT_AMBULATORY_CARE_PROVIDER_SITE_OTHER): Admitting: Obstetrics & Gynecology

## 2023-12-04 ENCOUNTER — Encounter: Payer: Self-pay | Admitting: Obstetrics & Gynecology

## 2023-12-04 VITALS — BP 111/67 | HR 74 | Wt 172.0 lb

## 2023-12-04 DIAGNOSIS — Z30017 Encounter for initial prescription of implantable subdermal contraceptive: Secondary | ICD-10-CM | POA: Diagnosis not present

## 2023-12-04 DIAGNOSIS — Z30432 Encounter for removal of intrauterine contraceptive device: Secondary | ICD-10-CM | POA: Diagnosis not present

## 2023-12-04 DIAGNOSIS — Z3202 Encounter for pregnancy test, result negative: Secondary | ICD-10-CM | POA: Diagnosis not present

## 2023-12-04 LAB — POCT URINE PREGNANCY: Preg Test, Ur: NEGATIVE

## 2023-12-04 MED ORDER — ETONOGESTREL 68 MG ~~LOC~~ IMPL
68.0000 mg | DRUG_IMPLANT | Freq: Once | SUBCUTANEOUS | Status: AC
  Administered 2023-12-04: 68 mg via SUBCUTANEOUS

## 2023-12-04 NOTE — Progress Notes (Signed)
    GYNECOLOGY OFFICE PROCEDURE NOTE  Kim Smith is a 28 y.o. G0P0000 here for Mirena  IUD removal. No GYN concerns.  Last pap smear was on 2024 and was normal.  IUD Removal  Patient identified, informed consent performed, consent signed.   Chaperone present.  Patient was placed in the dorsal lithotomy position, normal external genitalia was noted.  A speculum was placed in the patient's vagina, normal discharge was noted, no lesions. The cervix was visualized, no lesions, no abnormal discharge. The strings of the IUD were not visualized. The cervix was swabbed with betadine.  Kelly forceps were introduced into the endometrial cavity and the IUD was grasped and removed in its entirety  Patient tolerated the procedure well.    Patient will use Nexplanon  for contraception. Routine preventative health maintenance measures emphasized.   Eveline Lynwood MATSU, MD Obstetrician & Gynecologist, [facility] for [facility], [facility]

## 2023-12-04 NOTE — Addendum Note (Signed)
 Addended by: TANDA YORK CROME on: 12/04/2023 03:56 PM   Modules accepted: Orders

## 2023-12-04 NOTE — Progress Notes (Signed)
 GYNECOLOGY OFFICE PROCEDURE NOTE  [name] is a 28 y.o. G0P0000 here for Nexplanon  insertion.  Last pap smear was on 2024 and was normal.  No other gynecologic concerns.  Nexplanon  Insertion Procedure Patient identified, informed consent performed, consent signed.   Patient does understand that irregular bleeding is a very common side effect of this medication. She was advised to have backup contraception for one week after placement. Pregnancy test in clinic today was negative.  Appropriate time out taken.  Patient's left arm was prepped and draped in the usual sterile fashion. The ruler used to measure and mark insertion area.  Patient was prepped with alcohol swab and then injected with 3 ml of 1% lidocaine.  She was prepped with betadine, Nexplanon  removed from packaging,  Device confirmed in needle, then inserted full length of needle and withdrawn per handbook instructions. Nexplanon  was able to palpated in the patient's arm; patient palpated the insert herself. There was minimal blood loss.  Patient insertion site covered with gauze and a pressure bandage to reduce any bruising.  The patient tolerated the procedure well and was given post procedure instructions.  Abstain for one week after insertion. IUD was removed today. SABRA Eveline Lynwood KANDICE, MD Attending Obstetrician & Gynecologist, [facility]  12/04/2023
